# Patient Record
Sex: Male | Born: 1995 | Race: White | Hispanic: No | Marital: Single | Smoking: Former smoker
Health system: Southern US, Community
[De-identification: ages and names within clinical notes are randomized; demographics above are authoritative.]

## PROBLEM LIST (undated history)

## (undated) DIAGNOSIS — K219 Gastro-esophageal reflux disease without esophagitis: Secondary | ICD-10-CM

## (undated) DIAGNOSIS — I10 Essential (primary) hypertension: Secondary | ICD-10-CM

## (undated) DIAGNOSIS — F419 Anxiety disorder, unspecified: Secondary | ICD-10-CM

## (undated) DIAGNOSIS — I480 Paroxysmal atrial fibrillation: Secondary | ICD-10-CM

## (undated) HISTORY — DX: Essential (primary) hypertension: I10

## (undated) HISTORY — DX: Paroxysmal atrial fibrillation: I48.0

---

## 2018-10-26 ENCOUNTER — Other Ambulatory Visit: Payer: Self-pay

## 2018-10-30 ENCOUNTER — Ambulatory Visit: Payer: Self-pay | Admitting: Family Medicine

## 2018-11-01 ENCOUNTER — Other Ambulatory Visit: Payer: Self-pay

## 2018-11-01 ENCOUNTER — Encounter: Payer: Self-pay | Admitting: Family Medicine

## 2018-11-01 ENCOUNTER — Ambulatory Visit (INDEPENDENT_AMBULATORY_CARE_PROVIDER_SITE_OTHER): Payer: Self-pay | Admitting: Family Medicine

## 2018-11-01 VITALS — BP 118/64 | HR 91 | Temp 98.0°F | Resp 16 | Ht 68.5 in | Wt 217.8 lb

## 2018-11-01 DIAGNOSIS — Z0000 Encounter for general adult medical examination without abnormal findings: Secondary | ICD-10-CM

## 2018-11-01 DIAGNOSIS — E559 Vitamin D deficiency, unspecified: Secondary | ICD-10-CM

## 2018-11-01 DIAGNOSIS — Z6832 Body mass index (BMI) 32.0-32.9, adult: Secondary | ICD-10-CM

## 2018-11-01 DIAGNOSIS — Z713 Dietary counseling and surveillance: Secondary | ICD-10-CM

## 2018-11-01 LAB — CBC WITH DIFFERENTIAL/PLATELET
Basophils Absolute: 0.1 10*3/uL (ref 0.0–0.1)
Basophils Relative: 0.8 % (ref 0.0–3.0)
Eosinophils Absolute: 0.2 10*3/uL (ref 0.0–0.7)
Eosinophils Relative: 3.5 % (ref 0.0–5.0)
HCT: 43 % (ref 39.0–52.0)
Hemoglobin: 14.3 g/dL (ref 13.0–17.0)
Lymphocytes Relative: 30.2 % (ref 12.0–46.0)
Lymphs Abs: 2 10*3/uL (ref 0.7–4.0)
MCHC: 33.3 g/dL (ref 30.0–36.0)
MCV: 96.5 fl (ref 78.0–100.0)
Monocytes Absolute: 0.7 10*3/uL (ref 0.1–1.0)
Monocytes Relative: 10.1 % (ref 3.0–12.0)
Neutro Abs: 3.8 10*3/uL (ref 1.4–7.7)
Neutrophils Relative %: 55.4 % (ref 43.0–77.0)
Platelets: 282 10*3/uL (ref 150.0–400.0)
RBC: 4.46 Mil/uL (ref 4.22–5.81)
RDW: 12.4 % (ref 11.5–15.5)
WBC: 6.8 10*3/uL (ref 4.0–10.5)

## 2018-11-01 LAB — COMPREHENSIVE METABOLIC PANEL
ALT: 15 U/L (ref 0–53)
AST: 17 U/L (ref 0–37)
Albumin: 4.7 g/dL (ref 3.5–5.2)
Alkaline Phosphatase: 64 U/L (ref 39–117)
BUN: 10 mg/dL (ref 6–23)
CO2: 26 mEq/L (ref 19–32)
Calcium: 9.7 mg/dL (ref 8.4–10.5)
Chloride: 104 mEq/L (ref 96–112)
Creatinine, Ser: 0.87 mg/dL (ref 0.40–1.50)
GFR: 106.65 mL/min (ref >60.00)
Glucose, Bld: 96 mg/dL (ref 70–99)
Potassium: 4.2 mEq/L (ref 3.5–5.1)
Sodium: 140 mEq/L (ref 135–145)
Total Bilirubin: 0.5 mg/dL (ref 0.2–1.2)
Total Protein: 7.5 g/dL (ref 6.0–8.3)

## 2018-11-01 LAB — LIPID PANEL
Cholesterol: 158 mg/dL (ref 0–200)
HDL: 50.7 mg/dL (ref >39.00)
LDL Cholesterol: 97 mg/dL (ref 0–99)
NonHDL: 107.58
Total CHOL/HDL Ratio: 3
Triglycerides: 52 mg/dL (ref 0.0–149.0)
VLDL: 10.4 mg/dL (ref 0.0–40.0)

## 2018-11-01 LAB — VITAMIN D 25 HYDROXY (VIT D DEFICIENCY, FRACTURES): VITD: 13.62 ng/mL — ABNORMAL LOW (ref 30.00–100.00)

## 2018-11-01 LAB — TSH: TSH: 1.52 u[IU]/mL (ref 0.35–4.50)

## 2018-11-01 LAB — B12 AND FOLATE PANEL
Folate: 11.1 ng/mL (ref >5.9)
Vitamin B-12: 415 pg/mL (ref 211–911)

## 2018-11-01 NOTE — Patient Instructions (Signed)
Exercising to Lose Weight Exercise is structured, repetitive physical activity to improve fitness and health. Getting regular exercise is important for everyone. It is especially important if you are overweight. Being overweight increases your risk of heart disease, stroke, diabetes, high blood pressure, and several types of cancer. Reducing your calorie intake and exercising can help you lose weight. Exercise is usually categorized as moderate or vigorous intensity. To lose weight, most people need to do a certain amount of moderate-intensity or vigorous-intensity exercise each week. Moderate-intensity exercise  Moderate-intensity exercise is any activity that gets you moving enough to burn at least three times more energy (calories) than if you were sitting. Examples of moderate exercise include:  Walking a mile in 15 minutes.  Doing light yard work.  Biking at an easy pace. Most people should get at least 150 minutes (2 hours and 30 minutes) a week of moderate-intensity exercise to maintain their body weight. Vigorous-intensity exercise Vigorous-intensity exercise is any activity that gets you moving enough to burn at least six times more calories than if you were sitting. When you exercise at this intensity, you should be working hard enough that you are not able to carry on a conversation. Examples of vigorous exercise include:  Running.  Playing a team sport, such as football, basketball, and soccer.  Jumping rope. Most people should get at least 75 minutes (1 hour and 15 minutes) a week of vigorous-intensity exercise to maintain their body weight. How can exercise affect me? When you exercise enough to burn more calories than you eat, you lose weight. Exercise also reduces body fat and builds muscle. The more muscle you have, the more calories you burn. Exercise also:  Improves mood.  Reduces stress and tension.  Improves your overall fitness, flexibility, and endurance.   Increases bone strength. The amount of exercise you need to lose weight depends on:  Your age.  The type of exercise.  Any health conditions you have.  Your overall physical ability. Talk to your health care provider about how much exercise you need and what types of activities are safe for you. What actions can I take to lose weight? Nutrition   Make changes to your diet as told by your health care provider or diet and nutrition specialist (dietitian). This may include: ? Eating fewer calories. ? Eating more protein. ? Eating less unhealthy fats. ? Eating a diet that includes fresh fruits and vegetables, whole grains, low-fat dairy products, and lean protein. ? Avoiding foods with added fat, salt, and sugar.  Drink plenty of water while you exercise to prevent dehydration or heat stroke. Activity  Choose an activity that you enjoy and set realistic goals. Your health care provider can help you make an exercise plan that works for you.  Exercise at a moderate or vigorous intensity most days of the week. ? The intensity of exercise may vary from person to person. You can tell how intense a workout is for you by paying attention to your breathing and heartbeat. Most people will notice their breathing and heartbeat get faster with more intense exercise.  Do resistance training twice each week, such as: ? Push-ups. ? Sit-ups. ? Lifting weights. ? Using resistance bands.  Getting short amounts of exercise can be just as helpful as long structured periods of exercise. If you have trouble finding time to exercise, try to include exercise in your daily routine. ? Get up, stretch, and walk around every 30 minutes throughout the day. ? Go for a   walk during your lunch break. ? Park your car farther away from your destination. ? If you take public transportation, get off one stop early and walk the rest of the way. ? Make phone calls while standing up and walking around. ? Take the  stairs instead of elevators or escalators.  Wear comfortable clothes and shoes with good support.  Do not exercise so much that you hurt yourself, feel dizzy, or get very short of breath. Where to find more information  [place] Department of Health and Human Services: ThisPath.fiwww.hhs.gov  Centers for Disease Control and Prevention (CDC): FootballExhibition.com.brwww.cdc.gov Contact a health care provider:  Before starting a new exercise program.  If you have questions or concerns about your weight.  If you have a medical problem that keeps you from exercising. Get help right away if you have any of the following while exercising:  Injury.  Dizziness.  Difficulty breathing or shortness of breath that does not go away when you stop exercising.  Chest pain.  Rapid heartbeat. Summary  Being overweight increases your risk of heart disease, stroke, diabetes, high blood pressure, and several types of cancer.  Losing weight happens when you burn more calories than you eat.  Reducing the amount of calories you eat in addition to getting regular moderate or vigorous exercise each week helps you lose weight. This information is not intended to replace advice given to you by your health care provider. Make sure you discuss any questions you have with your health care provider. Document Released: 05/15/2010 Document Revised: 04/25/2017 Document Reviewed: 04/25/2017 Elsevier Patient Education  2020 Elsevier Inc.   Calorie Counting for [employer] Calories are units of energy. Your body needs a certain amount of calories from food to keep you going throughout the day. When you eat more calories than your body needs, your body stores the extra calories as fat. When you eat fewer calories than your body needs, your body burns fat to get the energy it needs. Calorie counting means keeping track of how many calories you eat and drink each day. Calorie counting can be helpful if you need to lose weight. If you make sure to eat  fewer calories than your body needs, you should lose weight. Ask your health care provider what a healthy weight is for you. For calorie counting to work, you will need to eat the right number of calories in a day in order to lose a healthy amount of weight per week. A dietitian can help you determine how many calories you need in a day and will give you suggestions on how to reach your calorie goal.  A healthy amount of weight to lose per week is usually 1-2 lb (0.5-0.9 kg). This usually means that your daily calorie intake should be reduced by 500-750 calories.  Eating 1,200 - 1,500 calories per day can help most women lose weight.  Eating 1,500 - 1,800 calories per day can help most men lose weight. What do I need to know about calorie counting? In order to meet your daily calorie goal, you will need to:  Find out how many calories are in each food you would like to eat. Try to do this before you eat.  Decide how much of the food you plan to eat.  Write down what you ate and how many calories it had. Doing this is called keeping a food log. To successfully lose weight, it is important to balance calorie counting with a healthy lifestyle that includes regular  activity. Aim for 150 minutes of moderate exercise (such as walking) or 75 minutes of vigorous exercise (such as running) each week. Where do I find calorie information?  The number of calories in a food can be found on a Nutrition Facts label. If a food does not have a Nutrition Facts label, try to look up the calories online or ask your dietitian for help. Remember that calories are listed per serving. If you choose to have more than one serving of a food, you will have to multiply the calories per serving by the amount of servings you plan to eat. For example, the label on a package of bread might say that a serving size is 1 slice and that there are 90 calories in a serving. If you eat 1 slice, you will have eaten 90 calories. If you  eat 2 slices, you will have eaten 180 calories. How do I keep a food log? Immediately after each meal, record the following information in your food log:  What you ate. Don't forget to include toppings, sauces, and other extras on the food.  How much you ate. This can be measured in cups, ounces, or number of items.  How many calories each food and drink had.  The total number of calories in the meal. Keep your food log near you, such as in a small notebook in your pocket, or use a mobile app or website. Some programs will calculate calories for you and show you how many calories you have left for the day to meet your goal. What are some calorie counting tips?   Use your calories on foods and drinks that will fill you up and not leave you hungry: ? Some examples of foods that fill you up are nuts and nut butters, vegetables, lean proteins, and high-fiber foods like whole grains. High-fiber foods are foods with more than 5 g fiber per serving. ? Drinks such as sodas, specialty coffee drinks, alcohol, and juices have a lot of calories, yet do not fill you up.  Eat nutritious foods and avoid empty calories. Empty calories are calories you get from foods or beverages that do not have many vitamins or protein, such as candy, sweets, and soda. It is better to have a nutritious high-calorie food (such as an avocado) than a food with few nutrients (such as a bag of chips).  Know how many calories are in the foods you eat most often. This will help you calculate calorie counts faster.  Pay attention to calories in drinks. Low-calorie drinks include water and unsweetened drinks.  Pay attention to nutrition labels for "low fat" or "fat free" foods. These foods sometimes have the same amount of calories or more calories than the full fat versions. They also often have added sugar, starch, or salt, to make up for flavor that was removed with the fat.  Find a way of tracking calories that works for you.  Get creative. Try different apps or programs if writing down calories does not work for you. What are some portion control tips?  Know how many calories are in a serving. This will help you know how many servings of a certain food you can have.  Use a measuring cup to measure serving sizes. You could also try weighing out portions on a kitchen scale. With time, you will be able to estimate serving sizes for some foods.  Take some time to put servings of different foods on your favorite plates, bowls, and cups  so you know what a serving looks like.  Try not to eat straight from a bag or box. Doing this can lead to overeating. Put the amount you would like to eat in a cup or on a plate to make sure you are eating the right portion.  Use smaller plates, glasses, and bowls to prevent overeating.  Try not to multitask (for example, watch TV or use your computer) while eating. If it is time to eat, sit down at a table and enjoy your food. This will help you to know when you are full. It will also help you to be aware of what you are eating and how much you are eating. What are tips for following this plan? Reading food labels  Check the calorie count compared to the serving size. The serving size may be smaller than what you are used to eating.  Check the source of the calories. Make sure the food you are eating is high in vitamins and protein and low in saturated and trans fats. Shopping  Read nutrition labels while you shop. This will help you make healthy decisions before you decide to purchase your food.  Make a grocery list and stick to it. Cooking  Try to cook your favorite foods in a healthier way. For example, try baking instead of frying.  Use low-fat dairy products. Meal planning  Use more fruits and vegetables. Half of your plate should be fruits and vegetables.  Include lean proteins like poultry and fish. How do I count calories when eating out?  Ask for smaller portion  sizes.  Consider sharing an entree and sides instead of getting your own entree.  If you get your own entree, eat only half. Ask for a box at the beginning of your meal and put the rest of your entree in it so you are not tempted to eat it.  If calories are listed on the menu, choose the lower calorie options.  Choose dishes that include vegetables, fruits, whole grains, low-fat dairy products, and lean protein.  Choose items that are boiled, broiled, grilled, or steamed. Stay away from items that are buttered, battered, fried, or served with cream sauce. Items labeled "crispy" are usually fried, unless stated otherwise.  Choose water, low-fat milk, unsweetened iced tea, or other drinks without added sugar. If you want an alcoholic beverage, choose a lower calorie option such as a glass of wine or light beer.  Ask for dressings, sauces, and syrups on the side. These are usually high in calories, so you should limit the amount you eat.  If you want a salad, choose a garden salad and ask for grilled meats. Avoid extra toppings like bacon, cheese, or fried items. Ask for the dressing on the side, or ask for olive oil and vinegar or lemon to use as dressing.  Estimate how many servings of a food you are given. For example, a serving of cooked rice is  cup or about the size of half a baseball. Knowing serving sizes will help you be aware of how much food you are eating at restaurants. The list below tells you how big or small some common portion sizes are based on everyday objects: ? 1 oz-4 stacked dice. ? 3 oz-1 deck of cards. ? 1 tsp-1 die. ? 1 Tbsp- a ping-pong ball. ? 2 Tbsp-1 ping-pong ball. ?  cup- baseball. ? 1 cup-1 baseball. Summary  Calorie counting means keeping track of how many calories you eat and drink each  day. If you eat fewer calories than your body needs, you should lose weight.  A healthy amount of weight to lose per week is usually 1-2 lb (0.5-0.9 kg). This usually  means reducing your daily calorie intake by 500-750 calories.  The number of calories in a food can be found on a Nutrition Facts label. If a food does not have a Nutrition Facts label, try to look up the calories online or ask your dietitian for help.  Use your calories on foods and drinks that will fill you up, and not on foods and drinks that will leave you hungry.  Use smaller plates, glasses, and bowls to prevent overeating. This information is not intended to replace advice given to you by your health care provider. Make sure you discuss any questions you have with your health care provider. Document Released: 04/12/2005 Document Revised: 12/30/2017 Document Reviewed: 03/12/2016 Elsevier Patient Education  2020 [employer]

## 2018-11-01 NOTE — Progress Notes (Signed)
Subjective:    Patient ID: Joseph Joseph, Joseph Joseph    DOB: 1995-12-22, 23 y.o.   MRN: [id number]  HPI   Patient presents to clinic to establish with primary care and for well adult exam.  Overall he is feeling well and has no complaints.  Only concern is that he gained some weight throughout the pandemic due to being out of work for approximately 4 months.  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  Tetanus vaccine is up-to-date, had in 2018.  He does see dentist twice per year and sees eye doctor every 1 to 2 years.  Currently does not wear glasses or contacts.  While at work they practice social distancing by spacing out the tables, he wears a mask for the shift and washes his hands diligently.  Denies any concerns for STDs.  Past medical, social, surgical & family history reviewed and updated accordingly in chart.  Patient Active Problem List   Diagnosis Date Noted  . BMI 32.0-32.9,adult 11/01/2018   Social History   Tobacco Use  . Smoking status: Former Games developermoker  . Smokeless tobacco: Never Used  Substance Use Topics  . Alcohol use: Yes    Comment: ocassionally   History reviewed. No pertinent surgical history.  History reviewed. No pertinent family history.   Review of Systems  Constitutional: Negative for chills, fatigue and fever.  HENT: Negative for congestion, ear pain, sinus pain and sore throat.   Eyes: Negative.   Respiratory: Negative for cough, shortness of breath and wheezing.   Cardiovascular: Negative for chest pain, palpitations and leg swelling.  Gastrointestinal: Negative for abdominal pain, diarrhea, nausea and vomiting.  Genitourinary: Negative for dysuria, frequency and urgency.  Musculoskeletal: Negative for arthralgias and myalgias.  Skin: Negative for color change, pallor and rash.  Neurological:  Negative for syncope, light-headedness and headaches.  Psychiatric/Behavioral: The patient is not nervous/anxious.       Objective:   Physical Exam Vitals signs and nursing note reviewed.  Constitutional:      General: He is not in acute distress.    Appearance: He is obese. He is not ill-appearing, toxic-appearing or diaphoretic.  HENT:     Head: Normocephalic and atraumatic.     Right Ear: Tympanic membrane, ear canal and external ear normal.     Left Ear: Tympanic membrane, ear canal and external ear normal.  Eyes:     General: No scleral icterus.    Extraocular Movements: Extraocular movements intact.     Conjunctiva/sclera: Conjunctivae normal.     Pupils: Pupils are equal, round, and reactive to light.  Neck:     Musculoskeletal: Normal range of motion and neck supple. No neck rigidity or muscular tenderness.  Cardiovascular:     Rate and Rhythm: Normal rate and regular rhythm.     Heart sounds: Normal heart sounds.  Pulmonary:     Effort: Pulmonary effort is normal. No respiratory distress.     Breath sounds: Normal breath sounds.  Abdominal:     General: Bowel sounds are normal. There is no distension.     Palpations: Abdomen is soft. There is no mass.     Tenderness: There is no abdominal tenderness. There is no right CVA tenderness, left CVA tenderness, guarding or rebound.     Hernia: No hernia is present.  Musculoskeletal:  Normal range of motion.     Right lower leg: No edema.     Left lower leg: No edema.  Lymphadenopathy:     Cervical: No cervical adenopathy.  Skin:    General: Skin is warm and dry.     Capillary Refill: Capillary refill takes less than 2 seconds.     Coloration: Skin is not jaundiced or pale.  Neurological:     General: No focal deficit present.     Mental Status: He is alert and oriented to person, place, and time.     Cranial Nerves: No cranial nerve deficit.     Motor: No weakness.     Gait: Gait normal.  Psychiatric:        Mood and  Affect: Mood normal.        Behavior: Behavior normal.        Thought Content: Thought content normal.        Judgment: Judgment normal.    Today's Vitals   11/01/18 1142  BP: 118/64  Pulse: 91  Resp: 16  Temp: 98 F (36.7 C)  TempSrc: Oral  SpO2: 95%  Weight: 217 lb 12.8 oz (98.8 kg)  Height: 5' 8.5" (1.74 m)   Body mass index is 32.63 kg/m.     Assessment & Plan:   Well adult exam - patient appears to be overall healthy 23 year old Joseph Joseph.  We will get screening lab work in clinic today to establish baseline.  Recommended safe sun practices of wearing at least SPF 30 when outdoors for extended period of time, hat to protect face and long sleeves to protect skin of arms.  Patient always wears seatbelt when in car.  Patient sees dentist and eye doctor regularly.  Discussed safe sex practices including condom use to prevent STDs.  Weight loss counseling/BMI 32.63 -- discussed healthy diet choices including lean proteins, lots of vegetables and fruits and lower carbs.  Also discussed calorie counting to help promote weight loss.  Also discussed regular physical activity to help promote weight loss including aerobic activity, weightlifting, walking and building up stamina and strength as exercise routines progress.  Patient aware that we will contact him with lab results when they are available.  Next step in plan of care will determine lab results and if patient needs to return to clinic sooner than for annual physical exams.  Patient also aware of flu vaccines will be available around September 2020

## 2018-11-02 MED ORDER — VITAMIN D (ERGOCALCIFEROL) 1.25 MG (50000 UNIT) PO CAPS: 50000.0000 [IU] | ORAL_CAPSULE | ORAL | 0 refills | Status: DC

## 2018-11-02 NOTE — Addendum Note (Signed)
Addended by: Philis Nettle on: 11/02/2018 05:13 PM   Modules accepted: Orders

## 2018-11-03 ENCOUNTER — Ambulatory Visit: Payer: Self-pay | Admitting: Family Medicine

## 2019-01-26 ENCOUNTER — Other Ambulatory Visit: Payer: Self-pay

## 2019-03-05 ENCOUNTER — Other Ambulatory Visit: Payer: Self-pay

## 2019-03-05 DIAGNOSIS — Z20822 Contact with and (suspected) exposure to covid-19: Secondary | ICD-10-CM

## 2019-03-06 ENCOUNTER — Telehealth: Payer: Self-pay | Admitting: Family Medicine

## 2019-03-06 ENCOUNTER — Encounter: Payer: Self-pay | Admitting: Family Medicine

## 2019-03-06 LAB — NOVEL CORONAVIRUS, NAA: SARS-CoV-2, NAA: DETECTED — AB

## 2019-03-06 NOTE — Telephone Encounter (Signed)
Noted thank you  LG

## 2019-03-06 NOTE — Telephone Encounter (Signed)
Critical result positive COVID!! [facility] notified office patient has not been notified.

## 2019-03-15 ENCOUNTER — Telehealth: Payer: Self-pay

## 2019-03-15 NOTE — Telephone Encounter (Signed)
Copied from [place] [id number]. Topic: General - Other >> Mar 15, 2019 11:53 AM Yvette Rack wrote: Reason for CRM: Pt stated he wanted to make the office aware that a leave of absence form was faxed from [place] from [employer].

## 2019-03-19 NOTE — Telephone Encounter (Signed)
Patient called to ask if the leave of absence forms were faxed to the office.  Please confirm and call patient back at [phone]

## 2019-03-19 NOTE — Telephone Encounter (Signed)
I spoke with patient & let him know that I had filled out FMLA paperwork. I have placed in Dr. Lupita Dawn folder for review & signing so I can fax back. I made patient aware if this & will let him know when it is faxed.

## 2019-03-19 NOTE — Telephone Encounter (Signed)
Patient is checking status on on FMLA paper work. Call back [phone]

## 2019-03-20 NOTE — Telephone Encounter (Signed)
Patient called and notified tat paperwork was faxed back & received fax confirmation.

## 2020-01-07 ENCOUNTER — Other Ambulatory Visit: Payer: Self-pay

## 2020-04-07 ENCOUNTER — Other Ambulatory Visit: Payer: Self-pay

## 2020-04-07 ENCOUNTER — Encounter: Payer: Self-pay | Admitting: Emergency Medicine

## 2020-04-07 ENCOUNTER — Emergency Department: Payer: Self-pay

## 2020-04-07 DIAGNOSIS — Z87891 Personal history of nicotine dependence: Secondary | ICD-10-CM | POA: Insufficient documentation

## 2020-04-07 DIAGNOSIS — M79602 Pain in left arm: Secondary | ICD-10-CM | POA: Insufficient documentation

## 2020-04-07 DIAGNOSIS — R202 Paresthesia of skin: Secondary | ICD-10-CM | POA: Insufficient documentation

## 2020-04-07 DIAGNOSIS — R079 Chest pain, unspecified: Secondary | ICD-10-CM | POA: Insufficient documentation

## 2020-04-07 LAB — CBC
HCT: 42.7 % (ref 39.0–52.0)
Hemoglobin: 14.5 g/dL (ref 13.0–17.0)
MCH: 31.7 pg (ref 26.0–34.0)
MCHC: 34 g/dL (ref 30.0–36.0)
MCV: 93.2 fL (ref 80.0–100.0)
Platelets: 263 10*3/uL (ref 150–400)
RBC: 4.58 MIL/uL (ref 4.22–5.81)
RDW: 12.7 % (ref 11.5–15.5)
WBC: 11.5 10*3/uL — ABNORMAL HIGH (ref 4.0–10.5)
nRBC: 0 % (ref 0.0–0.2)

## 2020-04-07 LAB — BASIC METABOLIC PANEL
Anion gap: 10 (ref 5–15)
BUN: 11 mg/dL (ref 6–20)
CO2: 25 mmol/L (ref 22–32)
Calcium: 9.5 mg/dL (ref 8.9–10.3)
Chloride: 105 mmol/L (ref 98–111)
Creatinine, Ser: 0.78 mg/dL (ref 0.61–1.24)
GFR, Estimated: >60 mL/min (ref >60)
Glucose, Bld: 116 mg/dL — ABNORMAL HIGH (ref 70–99)
Potassium: 4.1 mmol/L (ref 3.5–5.1)
Sodium: 140 mmol/L (ref 135–145)

## 2020-04-07 LAB — TROPONIN I (HIGH SENSITIVITY)
Troponin I (High Sensitivity): 3 ng/L (ref <18)
Troponin I (High Sensitivity): 3 ng/L (ref <18)
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
## 2020-04-08 ENCOUNTER — Emergency Department
Admission: EM | Admit: 2020-04-08 | Discharge: 2020-04-08 | Disposition: A | Discharge status: Home / Self Care | Payer: Self-pay | Attending: Emergency Medicine | Admitting: Emergency Medicine

## 2020-04-08 DIAGNOSIS — R079 Chest pain, unspecified: Secondary | ICD-10-CM

## 2020-04-08 NOTE — ED Notes (Signed)
E-sign not working. Printed, signed and placed in medical records.

## 2020-04-08 NOTE — ED Provider Notes (Signed)
[facility] Emergency Department Provider Note   ____________________________________________   Event Date/Time   First MD Initiated Contact with Patient 04/08/20 0003     (approximate)  I have reviewed the triage vital signs and the nursing notes.   HISTORY  Chief Complaint Chest Pain    HPI Joseph Joseph is a 24 y.o. male with no significant past medical history who presents to the ED complaining of chest pain.  Patient reports that he was driving when he had sudden onset sharp pain in the left side of his chest that seem to radiate into his left arm.  The sharp pain lasted for only a few seconds but he does still have very mild aching pain in the left side of his chest ongoing since then.  He had some numbness and tingling in his left arm with the onset of the pain, however this has also improved and he denies any weakness in his arm.  He denies any recent trauma to his chest and has never had similar symptoms in the past.  He has not had any fevers, cough, shortness of breath, pain or swelling in his legs.        History reviewed. No pertinent past medical history.  Patient Active Problem List   Diagnosis Date Noted  . BMI 32.0-32.9,adult 11/01/2018    History reviewed. No pertinent surgical history.  Prior to Admission medications   Medication Sig Start Date End Date Taking? Authorizing Provider  Vitamin D, Ergocalciferol, (DRISDOL) 1.25 MG (50000 UT) CAPS capsule Take 1 capsule (50,000 Units total) by mouth every 7 (seven) days. 11/02/18   Tracey Harries, FNP    Allergies Rocephin [ceftriaxone]  History reviewed. No pertinent family history.  Social History Social History   Tobacco Use  . Smoking status: Former Games developer  . Smokeless tobacco: Never Used  Vaping Use  . Vaping Use: Never used  Substance Use Topics  . Alcohol use: Yes    Comment: 4-5 drinks per week  . Drug use: Never    Review of Systems  Constitutional: No  fever/chills Eyes: No visual changes. ENT: No sore throat. Cardiovascular: Positive for chest pain. Respiratory: Denies shortness of breath. Gastrointestinal: No abdominal pain.  No nausea, no vomiting.  No diarrhea.  No constipation. Genitourinary: Negative for dysuria. Musculoskeletal: Negative for back pain. Skin: Negative for rash. Neurological: Negative for headaches or focal weakness.  Positive for left arm numbness and tingling.  ____________________________________________   PHYSICAL EXAM:  VITAL SIGNS: ED Triage Vitals  Enc Vitals Group     BP 04/07/20 1835 128/74     Pulse Rate 04/07/20 1835 76     Resp 04/07/20 1835 18     Temp 04/07/20 1835 98.6 F (37 C)     Temp Source 04/07/20 1835 Oral     SpO2 04/07/20 1835 99 %     Weight 04/07/20 1836 200 lb (90.7 kg)     Height 04/07/20 1836 5\' 8"  (1.727 m)     Head Circumference --      Peak Flow --      Pain Score 04/07/20 1835 1     Pain Loc --      Pain Edu? --      Excl. in GC? --     Constitutional: Alert and oriented. Eyes: Conjunctivae are normal. Head: Atraumatic. Nose: No congestion/rhinnorhea. Mouth/Throat: Mucous membranes are moist. Neck: Normal ROM Cardiovascular: Normal rate, regular rhythm. Grossly normal heart sounds.  2+ radial pulses  bilaterally. Respiratory: Normal respiratory effort.  No retractions. Lungs CTAB. Gastrointestinal: Soft and nontender. No distention. Genitourinary: deferred Musculoskeletal: No lower extremity tenderness nor edema. Neurologic:  Normal speech and language. No gross focal neurologic deficits are appreciated.  Capital 5 strength in bilateral upper extremities with no pronator drift. Skin:  Skin is warm, dry and intact. No rash noted. Psychiatric: Mood and affect are normal. Speech and behavior are normal.  ____________________________________________   LABS (all labs ordered are listed, but only abnormal results are displayed)  Labs Reviewed  BASIC METABOLIC  PANEL - Abnormal; Notable for the following components:      Result Value   Glucose, Bld 116 (*)    All other components within normal limits  CBC - Abnormal; Notable for the following components:   WBC 11.5 (*)    All other components within normal limits  TROPONIN I (HIGH SENSITIVITY)  TROPONIN I (HIGH SENSITIVITY)   ____________________________________________  EKG  ED ECG REPORT I, Chesley Noon, the attending physician, personally viewed and interpreted this ECG.   Date: 04/08/2020  EKG Time: 18:41  Rate: 91  Rhythm: normal sinus rhythm  Axis: Normal  Intervals:none  ST&T Change: None   PROCEDURES  Procedure(s) performed (including Critical Care):  Procedures   ____________________________________________   INITIAL IMPRESSION / ASSESSMENT AND PLAN / ED COURSE       24 year old male with no significant past medical history who presents to the ED complaining of sudden onset sharp in the left eyelid of his chest that has now eased up with only a mild aching remaining.  EKG reviewed and shows no evidence of arrhythmia or ischemia, 2 sets of troponin are negative.  Chest x-ray also reviewed by me and shows no infiltrate, edema, or effusion.  I doubt PE as patient is PERC negative and dissection unlikely with his nonresolving symptoms.  He is neurovascular intact to his bilateral upper extremities.  Lab work is unremarkable and patient is appropriate for discharge home with PCP follow-up.  He was counseled to return to the ED for new worsening symptoms, patient agrees with plan.      ____________________________________________   FINAL CLINICAL IMPRESSION(S) / ED DIAGNOSES  Final diagnoses:  Nonspecific chest pain     ED Discharge Orders    None       Note:  This document was prepared using Dragon voice recognition software and may include unintentional dictation errors.   Chesley Noon, MD 04/08/20 [phone]

## 2020-10-05 ENCOUNTER — Emergency Department
Admission: EM | Admit: 2020-10-05 | Discharge: 2020-10-05 | Disposition: A | Discharge status: Home / Self Care | Payer: Self-pay | Attending: Emergency Medicine | Admitting: Emergency Medicine

## 2020-10-05 ENCOUNTER — Other Ambulatory Visit: Payer: Self-pay

## 2020-10-05 DIAGNOSIS — R110 Nausea: Secondary | ICD-10-CM | POA: Insufficient documentation

## 2020-10-05 DIAGNOSIS — Z87891 Personal history of nicotine dependence: Secondary | ICD-10-CM | POA: Insufficient documentation

## 2020-10-05 DIAGNOSIS — R1031 Right lower quadrant pain: Secondary | ICD-10-CM | POA: Insufficient documentation

## 2020-10-05 LAB — COMPREHENSIVE METABOLIC PANEL
ALT: 15 U/L (ref 0–44)
AST: 19 U/L (ref 15–41)
Albumin: 4.4 g/dL (ref 3.5–5.0)
Alkaline Phosphatase: 55 U/L (ref 38–126)
Anion gap: 9 (ref 5–15)
BUN: 12 mg/dL (ref 6–20)
CO2: 23 mmol/L (ref 22–32)
Calcium: 9.4 mg/dL (ref 8.9–10.3)
Chloride: 105 mmol/L (ref 98–111)
Creatinine, Ser: 0.8 mg/dL (ref 0.61–1.24)
GFR, Estimated: >60 mL/min (ref >60)
Glucose, Bld: 87 mg/dL (ref 70–99)
Potassium: 3.9 mmol/L (ref 3.5–5.1)
Sodium: 137 mmol/L (ref 135–145)
Total Bilirubin: 1.4 mg/dL — ABNORMAL HIGH (ref 0.3–1.2)
Total Protein: 7.6 g/dL (ref 6.5–8.1)

## 2020-10-05 LAB — CBC
HCT: 43.2 % (ref 39.0–52.0)
Hemoglobin: 14.9 g/dL (ref 13.0–17.0)
MCH: 31.1 pg (ref 26.0–34.0)
MCHC: 34.5 g/dL (ref 30.0–36.0)
MCV: 90.2 fL (ref 80.0–100.0)
Platelets: 257 10*3/uL (ref 150–400)
RBC: 4.79 MIL/uL (ref 4.22–5.81)
RDW: 12.3 % (ref 11.5–15.5)
WBC: 9.8 10*3/uL (ref 4.0–10.5)
nRBC: 0 % (ref 0.0–0.2)

## 2020-10-05 LAB — URINALYSIS, COMPLETE (UACMP) WITH MICROSCOPIC
Bacteria, UA: NONE SEEN
Bilirubin Urine: NEGATIVE
Glucose, UA: NEGATIVE mg/dL
Ketones, ur: 20 mg/dL — AB
Leukocytes,Ua: NEGATIVE
Nitrite: NEGATIVE
Protein, ur: NEGATIVE mg/dL
Specific Gravity, Urine: 1.001 — ABNORMAL LOW (ref 1.005–1.030)
Squamous Epithelial / HPF: NONE SEEN (ref 0–5)
pH: 6 (ref 5.0–8.0)

## 2020-10-05 LAB — LIPASE, BLOOD: Lipase: 28 U/L (ref 11–51)

## 2020-10-05 MED ORDER — DICYCLOMINE HCL 10 MG PO CAPS: 10.0000 mg | ORAL_CAPSULE | Freq: Three times a day (TID) | ORAL | 0 refills | Status: DC | PRN

## 2020-10-05 NOTE — Discharge Instructions (Addendum)
Try taking Bentyl capsules up to 3 times a day for your cramping and pain.  This is a very safe and benign medicine that can help with irritable bowel and cramping.  Follow-up with your PCP in the next week or 2 to discuss your symptoms and see if you are getting better with this medication.  Return to the ED with any severely worsening pain, fevers with your pain or recurrently vomiting

## 2020-10-05 NOTE — ED Triage Notes (Signed)
Pt arrives to ED from home via PoV with c/c of right side abdominal pain beginning approx 3 days ago. Pt complains of decreased appetite, last bowel movement was over 24 hours ago. Pain described as "sharp cramping", intermittent. Pt denies F/C, NVD. Pt is currently taking pantoprazole for Gerd DX but reports no relief. Upon arrival, Pt A&Ox4, NAD.

## 2020-10-05 NOTE — ED Provider Notes (Signed)
[facility] Provider Note ____________________________________________   Event Date/Time   First MD Initiated Contact with Patient 10/05/20 [phone]     (approximate)  I have reviewed the triage vital signs and the nursing notes.  HISTORY  Chief Complaint Abdominal Pain   HPI Joseph Joseph is a 25 y.o. malewho presents to the ED for evaluation of abd pain.   Chart review indicates hx obesity.   Patient presents to the ED for evaluation of 3 days of right-sided abdominal cramping and pain he reports a constant cramping pain for a few days now that is sometimes associated with nausea, but no emesis.  Denies diarrhea or stool changes.  Denies dysuria or urinary changes.  Reports tolerating p.o. intake and toileting at his baseline.  Denies fevers or systemic symptoms.  No past medical history on file.  Patient Active Problem List   Diagnosis Date Noted   BMI 32.0-32.9,adult 11/01/2018    No past surgical history on file.  Prior to Admission medications   Medication Sig Start Date End Date Taking? Authorizing Provider  dicyclomine (BENTYL) 10 MG capsule Take 1 capsule (10 mg total) by mouth 3 (three) times daily as needed for spasms (abd cramping and spasms). 10/05/20 11/04/20 Yes Delton Prairie, MD  Vitamin D, Ergocalciferol, (DRISDOL) 1.25 MG (50000 UT) CAPS capsule Take 1 capsule (50,000 Units total) by mouth every 7 (seven) days. 11/02/18   Tracey Harries, FNP    Allergies Rocephin [ceftriaxone]  No family history on file.  Social History Social History   Tobacco Use   Smoking status: Former    Pack years: 0.00   Smokeless tobacco: Never  Vaping Use   Vaping Use: Never used  Substance Use Topics   Alcohol use: Yes    Comment: 4-5 drinks per week   Drug use: Never    Review of Systems  Constitutional: No fever/chills Eyes: No visual changes. ENT: No sore throat. Cardiovascular: Denies chest pain. Respiratory: Denies  shortness of breath. Gastrointestinal: Positive for abdominal pain and nausea no vomiting.  No diarrhea.  No constipation. Genitourinary: Negative for dysuria. Musculoskeletal: Negative for back pain. Skin: Negative for rash. Neurological: Negative for headaches, focal weakness or numbness.  ____________________________________________   PHYSICAL EXAM:  VITAL SIGNS: Vitals:   10/05/20 0507 10/05/20 0519  BP: 113/70 123/78  Pulse: 63 66  Resp: 19 18  Temp: 97.8 F (36.6 C)   SpO2: 99% 99%     Constitutional: Alert and oriented. Well appearing and in no acute distress. Eyes: Conjunctivae are normal. PERRL. EOMI. Head: Atraumatic. Nose: No congestion/rhinnorhea. Mouth/Throat: Mucous membranes are moist.  Oropharynx non-erythematous. Neck: No stridor. No cervical spine tenderness to palpation. Cardiovascular: Normal rate, regular rhythm. Grossly normal heart sounds.  Good peripheral circulation. Respiratory: Normal respiratory effort.  No retractions. Lungs CTAB. Gastrointestinal: Soft , nondistended, nontender to palpation. No CVA tenderness.  Soft and benign throughout.  Well-tolerated, even with quite deep palpation to the right lower quadrant Musculoskeletal: No lower extremity tenderness nor edema.  No joint effusions. No signs of acute trauma. Neurologic:  Normal speech and language. No gross focal neurologic deficits are appreciated. No gait instability noted. Skin:  Skin is warm, dry and intact. No rash noted. Psychiatric: Mood and affect are normal. Speech and behavior are normal.  ____________________________________________   LABS (all labs ordered are listed, but only abnormal results are displayed)  Labs Reviewed  COMPREHENSIVE METABOLIC PANEL - Abnormal; Notable for the following components:  Result Value   Total Bilirubin 1.4 (*)    All other components within normal limits  URINALYSIS, COMPLETE (UACMP) WITH MICROSCOPIC - Abnormal; Notable for the  following components:   Color, Urine COLORLESS (*)    APPearance CLEAR (*)    Specific Gravity, Urine 1.001 (*)    Hgb urine dipstick SMALL (*)    Ketones, ur 20 (*)    All other components within normal limits  LIPASE, BLOOD  CBC   ____________________________________________   PROCEDURES and INTERVENTIONS  Procedure(s) performed (including Critical Care):  Procedures  Medications - No data to display  ____________________________________________   MDM / ED COURSE   Obese 25 year old male presents to the ED with vague right-sided abdominal cramping pain, without evidence of acute derangements and amenable to outpatient management.  Normal vitals on room air.  Exam reassuring without evidence of acute pathology.  He has no abdominal tenderness that I can elicit on exam, even with deep palpation, particular to the RLQ at McBurney's point.  His blood work is benign and urine is without infectious features.  He is tolerating p.o. intake and toileting at baseline.  I see no indications for advanced imaging of his abdomen.  We will discharge with a short course of Bentyl to see if this will help and have him follow-up with his PCP.  Return precautions for the ED were discussed.     ____________________________________________   FINAL CLINICAL IMPRESSION(S) / ED DIAGNOSES  Final diagnoses:  Right lower quadrant abdominal pain     ED Discharge Orders          Ordered    dicyclomine (BENTYL) 10 MG capsule  3 times daily PRN        10/05/20 0548             Anushree Dorsi   Note:  This document was prepared using Dragon voice recognition software and may include unintentional dictation errors.    Delton Prairie, MD 10/05/20 [phone]

## 2020-10-23 ENCOUNTER — Ambulatory Visit: Payer: Self-pay | Admitting: Internal Medicine

## 2020-10-23 DIAGNOSIS — Z0289 Encounter for other administrative examinations: Secondary | ICD-10-CM

## 2021-02-21 ENCOUNTER — Other Ambulatory Visit: Payer: Self-pay

## 2021-02-21 ENCOUNTER — Emergency Department: Payer: No Typology Code available for payment source

## 2021-02-21 DIAGNOSIS — Z87891 Personal history of nicotine dependence: Secondary | ICD-10-CM | POA: Insufficient documentation

## 2021-02-21 DIAGNOSIS — H53149 Visual discomfort, unspecified: Secondary | ICD-10-CM | POA: Diagnosis not present

## 2021-02-21 DIAGNOSIS — R519 Headache, unspecified: Secondary | ICD-10-CM | POA: Diagnosis not present

## 2021-02-21 LAB — CBC
HCT: 44.6 % (ref 39.0–52.0)
Hemoglobin: 15.4 g/dL (ref 13.0–17.0)
MCH: 31.6 pg (ref 26.0–34.0)
MCHC: 34.5 g/dL (ref 30.0–36.0)
MCV: 91.6 fL (ref 80.0–100.0)
Platelets: 265 10*3/uL (ref 150–400)
RBC: 4.87 MIL/uL (ref 4.22–5.81)
RDW: 12.7 % (ref 11.5–15.5)
WBC: 11 10*3/uL — ABNORMAL HIGH (ref 4.0–10.5)
nRBC: 0 % (ref 0.0–0.2)

## 2021-02-21 LAB — BASIC METABOLIC PANEL
Anion gap: 12 (ref 5–15)
BUN: 10 mg/dL (ref 6–20)
CO2: 22 mmol/L (ref 22–32)
Calcium: 9.6 mg/dL (ref 8.9–10.3)
Chloride: 103 mmol/L (ref 98–111)
Creatinine, Ser: 0.77 mg/dL (ref 0.61–1.24)
GFR, Estimated: >60 mL/min (ref >60)
Glucose, Bld: 107 mg/dL — ABNORMAL HIGH (ref 70–99)
Potassium: 3.7 mmol/L (ref 3.5–5.1)
Sodium: 137 mmol/L (ref 135–145)

## 2021-02-21 NOTE — ED Triage Notes (Addendum)
Pt presents to ER c/o HA x1 month that has been getting worse on the left side over the last couple of weeks.  Pt states he is also having some new left sided numbness but denies weakness in his extremities.  No slurred speech noted.  LVO neg.  Pt does have family hx of stroke on his moms side.  No other acute neuro deficits.

## 2021-02-22 ENCOUNTER — Emergency Department
Admission: EM | Admit: 2021-02-22 | Discharge: 2021-02-22 | Disposition: A | Discharge status: Home / Self Care | Payer: No Typology Code available for payment source | Attending: Emergency Medicine | Admitting: Emergency Medicine

## 2021-02-22 DIAGNOSIS — R519 Headache, unspecified: Secondary | ICD-10-CM

## 2021-02-22 MED ORDER — KETOROLAC TROMETHAMINE 60 MG/2ML IM SOLN
30.0000 mg | Freq: Once | INTRAMUSCULAR | Status: AC
  Administered 2021-02-22: 08:00:00 30 mg via INTRAMUSCULAR
  Filled 2021-02-22: qty 2

## 2021-02-22 MED ORDER — KETOROLAC TROMETHAMINE 10 MG PO TABS: 10.0000 mg | ORAL_TABLET | Freq: Four times a day (QID) | ORAL | 0 refills | Status: DC | PRN

## 2021-02-22 NOTE — ED Provider Notes (Signed)
[facility] Emergency Department Provider Note ____________________________________________  Time seen: Approximately 7:45 AM  I have reviewed the triage vital signs and the nursing notes.   HISTORY  Chief Complaint Headache   HPI Joseph Joseph is a 25 y.o. male presenting to the emergency department for treatment and evaluation of headache that has been persistent for the past month.  Location: Left side Similar to previous headaches: No Duration: 1 month TIMING: Intermittent SEVERITY: 4 out of 10 QUALITY: Throbbing CONTEXT: No known exposure or trigger MODIFYING FACTORS: No relief with acetaminophen ASSOCIATED SYMPTOMS: Occasional photophobia History reviewed. No pertinent past medical history.  Patient Active Problem List   Diagnosis Date Noted   BMI 32.0-32.9,adult 11/01/2018    History reviewed. No pertinent surgical history.  Prior to Admission medications   Medication Sig Start Date End Date Taking? Authorizing Provider  ketorolac (TORADOL) 10 MG tablet Take 1 tablet (10 mg total) by mouth every 6 (six) hours as needed. 02/22/21  Yes Maccoy Haubner B, FNP  dicyclomine (BENTYL) 10 MG capsule Take 1 capsule (10 mg total) by mouth 3 (three) times daily as needed for spasms (abd cramping and spasms). 10/05/20 11/04/20  Delton Prairie, MD  Vitamin D, Ergocalciferol, (DRISDOL) 1.25 MG (50000 UT) CAPS capsule Take 1 capsule (50,000 Units total) by mouth every 7 (seven) days. 11/02/18   Tracey Harries, FNP    Allergies Rocephin [ceftriaxone]  History reviewed. No pertinent family history.  Social History Social History   Tobacco Use   Smoking status: Former   Smokeless tobacco: Never  Building services engineer Use: Never used  Substance Use Topics   Alcohol use: Yes    Comment: 4-5 drinks per week   Drug use: Never    Review of Systems Constitutional: No fever/chills or recent injury. Eyes: No visual changes. ENT: No sore  throat. Respiratory: Denies shortness of breath. Gastrointestinal: No abdominal pain.  No nausea, no vomiting.  No diarrhea.  No constipation. Musculoskeletal: Negative for pain. Skin: Negative for rash. Neurological:Positive for headache, negative for focal weakness or numbness. No confusion or fainting. ___________________________________________   PHYSICAL EXAM:  VITAL SIGNS: ED Triage Vitals  Enc Vitals Group     BP 02/21/21 2040 134/80     Pulse Rate 02/21/21 2040 87     Resp 02/21/21 2040 18     Temp 02/21/21 2040 98.4 F (36.9 C)     Temp Source 02/21/21 2040 Oral     SpO2 02/21/21 2040 96 %     Weight 02/21/21 2041 200 lb (90.7 kg)     Height 02/21/21 2041 5\' 9"  (1.753 m)     Head Circumference --      Peak Flow --      Pain Score 02/21/21 2041 3     Pain Loc --      Pain Edu? --      Excl. in GC? --     Constitutional: Alert and oriented. Well appearing and in no acute distress. Eyes: Conjunctivae are normal. PERRL. EOMI without expressed pain. No evidence of papilledema on limited exam. Head: Atraumatic. Nose: No congestion/rhinnorhea. Mouth/Throat: Mucous membranes are moist.  Oropharynx non-erythematous. Neck: No stridor. Supple, no meningismus.  Cardiovascular: Normal rate, regular rhythm. Grossly normal heart sounds.  Good peripheral circulation. Respiratory: Normal respiratory effort.  No retractions. Lungs CTAB. Gastrointestinal: Soft and nontender. No distention.  Musculoskeletal: No lower extremity tenderness nor edema.  No joint effusions. Neurologic:  Normal speech and language. No gross  focal neurologic deficits are appreciated. No gait instability. Cranial nerves: 2-10 normal as tested. Cerebellar:Normal Romberg, finger-nose-finger, heel to shin, normal gait. Sensorimotor: No aphasia, pronator drift, clonus, sensory loss or abnormal reflexes.  Skin:  Skin is warm, dry and intact. No rash noted. Psychiatric: Mood and affect are normal. Speech and  behavior are normal. Normal thought process and cognition.  ____________________________________________   LABS (all labs ordered are listed, but only abnormal results are displayed)  Labs Reviewed  CBC - Abnormal; Notable for the following components:      Result Value   WBC 11.0 (*)    All other components within normal limits  BASIC METABOLIC PANEL - Abnormal; Notable for the following components:   Glucose, Bld 107 (*)    All other components within normal limits   ____________________________________________  EKG  Not indicated. ____________________________________________  RADIOLOGY  CT HEAD WO CONTRAST ( )  Result Date: 02/21/2021 CLINICAL DATA:  Headache, new or worsening, neuro deficit (Age 89-49y) Headache x1 month that is getting progressively worse EXAM: CT HEAD WITHOUT CONTRAST TECHNIQUE: Contiguous axial images were obtained from the base of the skull through the vertex without intravenous contrast. COMPARISON:  None. FINDINGS: Brain: Normal anatomic configuration. No abnormal intra or extra-axial mass lesion or fluid collection. No abnormal mass effect or midline shift. No evidence of acute intracranial hemorrhage or infarct. Ventricular size is normal. Cerebellum unremarkable. Vascular: Unremarkable Skull: Intact Sinuses/Orbits: Paranasal sinuses are clear. Orbits are unremarkable. Other: Mastoid air cells and middle ear cavities are clear. IMPRESSION: No acute intracranial abnormality.  Normal examination. Electronically Signed   By: Helyn Numbers M.D.   On: 02/21/2021 21:10   ____________________________________________   PROCEDURES  Procedure(s) performed:  Procedures  Critical Care performed: None ____________________________________________   INITIAL IMPRESSION / ASSESSMENT AND PLAN / ED COURSE  25 year old male presenting to the emergency department for treatment and evaluation of headache.  See HPI for further details.  Exam is overall reassuring.   Labs are without concern.  CT negative for acute findings per radiology.  Patient given IM Toradol while in the emergency department.  Prescription for the same sent to the pharmacy.  Patient encouraged to follow-up with neurology.  If her symptoms change or worsen if he is unable to see primary care or the neurologist he is to return to the emergency department.  Pertinent labs & imaging results that were available during my care of the patient were reviewed by me and considered in my medical decision making (see chart for details). ____________________________________________   FINAL CLINICAL IMPRESSION(S) / ED DIAGNOSES  Final diagnoses:  Headache disorder    ED Discharge Orders          Ordered    ketorolac (TORADOL) 10 MG tablet  Every 6 hours PRN       Note to Pharmacy: Injection given in ER   02/22/21 0754             Chinita Pester, FNP 02/22/21 1191    Arnaldo Natal, MD 02/22/21 1547

## 2021-02-22 NOTE — Discharge Instructions (Signed)
Please follow up with neurology.  Return to the ER for symptoms that change or worsen if unable to see the specialist or primary care.

## 2021-02-22 NOTE — ED Notes (Signed)
Patient denies any changes to his symptoms.  Patient ambulatory back and forth to bathroom in triage without difficulty.  NAD noted.

## 2021-04-08 ENCOUNTER — Other Ambulatory Visit: Payer: Self-pay

## 2021-04-08 ENCOUNTER — Ambulatory Visit
Admission: EM | Admit: 2021-04-08 | Discharge: 2021-04-08 | Disposition: A | Discharge status: Home / Self Care | Payer: No Typology Code available for payment source

## 2021-04-08 DIAGNOSIS — K2100 Gastro-esophageal reflux disease with esophagitis, without bleeding: Secondary | ICD-10-CM

## 2021-04-08 DIAGNOSIS — R079 Chest pain, unspecified: Secondary | ICD-10-CM

## 2021-04-08 MED ORDER — PANTOPRAZOLE SODIUM 40 MG PO TBEC: 40.0000 mg | DELAYED_RELEASE_TABLET | Freq: Every day | ORAL | 2 refills | Status: DC

## 2021-04-08 NOTE — ED Provider Notes (Signed)
[facility]    CSN: [id number] Arrival date & time: 04/08/21  1500    HISTORY  No chief complaint on file.  HPI Joseph Joseph is a 25 y.o. male. Patient reports having pain in epigastric area that has not moved to upper chest. Patient states that the pain is like a burning sensation that only lasts a few minutes and resolves.  States this pain began a week ago.  EMR reviewed, patient has been to urgent care multiple times in the past 2 years with a chief complaint of chest pain, cardiac work-up that was unremarkable.  Patient states he has previously been prescribed pantoprazole for heartburn symptoms, states he is not currently taking this.  Patient states that he has a family history of cardiac disease, states he wants to get "checked out".  Patient localizes his pain to right upper quadrant immediately lateral to epigastric area and to the left of his mid sternum.  Patient also reports a history of anxiety, states he is previously been prescribed BuSpar for this.  The history is provided by the patient.  History reviewed. No pertinent past medical history. Patient Active Problem List   Diagnosis Date Noted   BMI 32.0-32.9,adult 11/01/2018   History reviewed. No pertinent surgical history.  Home Medications    Prior to Admission medications   Medication Sig Start Date End Date Taking? Authorizing Provider  busPIRone (BUSPAR) 7.5 MG tablet Take 7.5 mg by mouth 3 (three) times daily.   Yes [provider]  pantoprazole (PROTONIX) 20 MG tablet Take 20 mg by mouth daily.   Yes [provider]    Family History History reviewed. No pertinent family history. Social History Social History   Tobacco Use   Smoking status: Former   Smokeless tobacco: Never  Building services engineer Use: Never used  Substance Use Topics   Alcohol use: Yes    Comment: 4-5 drinks per week   Drug use: Never   Allergies   Rocephin [ceftriaxone]  Review of Systems Review  of Systems Pertinent findings noted in history of present illness.   Physical Exam Triage Vital Signs ED Triage Vitals  Enc Vitals Group     BP 02/20/21 0827 (!) 147/82     Pulse Rate 02/20/21 0827 72     Resp 02/20/21 0827 18     Temp 02/20/21 0827 98.3 F (36.8 C)     Temp Source 02/20/21 0827 Oral     SpO2 02/20/21 0827 98 %     Weight --      Height --      Head Circumference --      Peak Flow --      Pain Score 02/20/21 0826 5     Pain Loc --      Pain Edu? --      Excl. in GC? --   No data found.  Updated Vital Signs BP 109/74 (BP Location: Right Arm)    Pulse 68    Temp 98 F (36.7 C) (Oral)    Resp 18    SpO2 97%   Physical Exam  Visual Acuity Right Eye Distance:   Left Eye Distance:   Bilateral Distance:    Right Eye Near:   Left Eye Near:    Bilateral Near:     UC Couse / Diagnostics / Procedures:    EKG  Radiology No results found.  Procedures Procedures (including critical care time)  UC Diagnoses / Final Clinical Impressions(s)  I have reviewed the triage vital signs and the nursing notes.  Pertinent labs & imaging results that were available during my care of the patient were reviewed by me and considered in my medical decision making (see chart for details).    Final diagnoses:  Chest pain, unspecified type  Gastroesophageal reflux disease with esophagitis without hemorrhage   Patient advised begin pantoprazole, EKG essentially normal.  Follow-up with primary care 1 month.  ED Prescriptions     Medication Sig Dispense Auth. Provider   pantoprazole (PROTONIX) 40 MG tablet Take 1 tablet (40 mg total) by mouth daily. 30 tablet Theadora Rama Scales, PA-C      PDMP not reviewed this encounter.  Pending results:  Labs Reviewed - No data to display  Medications Ordered in UC: Medications - No data to display  Disposition Upon Discharge:  Condition: stable for discharge home Home: take medications as prescribed; routine discharge  instructions as discussed; follow up as advised.  Patient presented with an acute illness with associated systemic symptoms and significant discomfort requiring urgent management. In my opinion, this is a condition that a prudent lay person (someone who possesses an average knowledge of health and medicine) may potentially expect to result in complications if not addressed urgently such as respiratory distress, impairment of bodily function or dysfunction of bodily organs.   Routine symptom specific, illness specific and/or disease specific instructions were discussed with the patient and/or caregiver at length.   As such, the patient has been evaluated and assessed, work-up was performed and treatment was provided in alignment with urgent care protocols and evidence based medicine.  Patient/parent/caregiver has been advised that the patient may require follow up for further testing and treatment if the symptoms continue in spite of treatment, as clinically indicated and appropriate.  If the patient was tested for COVID-19, Influenza and/or RSV, then the patient/parent/guardian was advised to isolate at home pending the results of his/her diagnostic coronavirus test and potentially longer if theyre positive. I have also advised pt that if his/her COVID-19 test returns positive, it's recommended to self-isolate for at least 10 days after symptoms first appeared AND until fever-free for 24 hours without fever reducer AND other symptoms have improved or resolved. Discussed self-isolation recommendations as well as instructions for household member/close contacts as per the [facility] and [place] DHHS, and also gave patient the COVID packet with this information.  Patient/parent/caregiver has been advised to return to the [facility] or PCP in 3-5 days if no better; to PCP or the Emergency Department if new signs and symptoms develop, or if the current signs or symptoms continue to change or worsen for further workup, evaluation  and treatment as clinically indicated and appropriate  The patient will follow up with their current PCP if and as advised. If the patient does not currently have a PCP we will assist them in obtaining one.   The patient may need specialty follow up if the symptoms continue, in spite of conservative treatment and management, for further workup, evaluation, consultation and treatment as clinically indicated and appropriate.   Patient/parent/caregiver verbalized understanding and agreement of plan as discussed.  All questions were addressed during visit.  Please see discharge instructions below for further details of plan.  Discharge Instructions:   Discharge Instructions      Your EKG today is completely normal, I have no concerns for any cardiac dysfunction.  As I mentioned, the location of your pain is most likely related to uncontrolled gastroesophageal reflux disease and  can easily be treated with daily doses of pantoprazole 40 mg.  I recommend that you take it for the next 30 days and follow-up with your primary care provider to discuss this medications benefits and whether or not you should continue taking it.         Theadora Rama Scales, PA-C 04/08/21 [phone]

## 2021-04-08 NOTE — Discharge Instructions (Addendum)
Your EKG today is completely normal, I have no concerns for any cardiac dysfunction.  As I mentioned, the location of your pain is most likely related to uncontrolled gastroesophageal reflux disease and can easily be treated with daily doses of pantoprazole 40 mg.  I recommend that you take it for the next 30 days and follow-up with your primary care provider to discuss this medications benefits and whether or not you should continue taking it.

## 2021-04-08 NOTE — ED Triage Notes (Signed)
Patient reports having pain in epigastric area that has not moved to upper chest. Patient states that the pain is like a burning sensation that only lasts a few minutes and resolves.  Started: a week ago

## 2021-05-20 LAB — HM HEPATITIS C SCREENING LAB: HM Hepatitis Screen: NEGATIVE

## 2021-05-20 LAB — HM HIV SCREENING LAB: HM HIV Screening: NEGATIVE

## 2021-06-10 ENCOUNTER — Other Ambulatory Visit: Payer: Self-pay

## 2021-06-10 ENCOUNTER — Encounter: Payer: Self-pay | Admitting: Emergency Medicine

## 2021-06-10 ENCOUNTER — Ambulatory Visit
Admission: EM | Admit: 2021-06-10 | Discharge: 2021-06-10 | Disposition: A | Discharge status: Home / Self Care | Payer: No Typology Code available for payment source | Attending: Emergency Medicine | Admitting: Emergency Medicine

## 2021-06-10 DIAGNOSIS — B349 Viral infection, unspecified: Secondary | ICD-10-CM

## 2021-06-10 HISTORY — DX: Gastro-esophageal reflux disease without esophagitis: K21.9

## 2021-06-10 LAB — POCT RAPID STREP A (OFFICE): Rapid Strep A Screen: NEGATIVE

## 2021-06-10 NOTE — ED Triage Notes (Signed)
Pt presents with fever, cough and ST x 5 days.

## 2021-06-10 NOTE — ED Provider Notes (Signed)
Joseph Joseph    CSN: [id number] Arrival date & time: 06/10/21  1547      History   Chief Complaint Chief Complaint  Patient presents with   Fever   Cough   Sore Throat    HPI Joseph Joseph is a 26 y.o. male.  Patient presents with 2-3 day history of fever, sore throat, cough.  Tmax 102.  Treatment at home with Tylenol.  He denies rash, shortness of breath, vomiting, diarrhea, or other symptoms.  His medical history includes GERD.  The history is provided by the patient.   Past Medical History:  Diagnosis Date   GERD (gastroesophageal reflux disease)     Patient Active Problem List   Diagnosis Date Noted   BMI 32.0-32.9,adult 11/01/2018    History reviewed. No pertinent surgical history.     Home Medications    Prior to Admission medications   Medication Sig Start Date End Date Taking? Authorizing Provider  pantoprazole (PROTONIX) 40 MG tablet Take 1 tablet (40 mg total) by mouth daily. 04/08/21 07/07/21 Yes Theadora Rama Scales, PA-C  busPIRone (BUSPAR) 7.5 MG tablet Take 7.5 mg by mouth 3 (three) times daily.    [provider]    Family History History reviewed. No pertinent family history.  Social History Social History   Tobacco Use   Smoking status: Former   Smokeless tobacco: Never  Building services engineer Use: Never used  Substance Use Topics   Alcohol use: Yes    Comment: 4-5 drinks per week   Drug use: Never     Allergies   Rocephin [ceftriaxone]   Review of Systems Review of Systems  Constitutional:  Positive for fever. Negative for chills.  HENT:  Positive for sore throat. Negative for ear pain.   Respiratory:  Positive for cough. Negative for shortness of breath.   Cardiovascular:  Negative for chest pain and palpitations.  Gastrointestinal:  Negative for diarrhea and vomiting.  Skin:  Negative for color change and rash.  All other systems reviewed and are negative.   Physical Exam Triage Vital Signs ED  Triage Vitals [06/10/21 1600]  Enc Vitals Group     BP 114/72     Pulse Rate 87     Resp 18     Temp 99.5 F (37.5 C)     Temp src      SpO2 96 %     Weight      Height      Head Circumference      Peak Flow      Pain Score 0     Pain Loc      Pain Edu?      Excl. in GC?    No data found.  Updated Vital Signs BP 114/72    Pulse 87    Temp 99.5 F (37.5 C)    Resp 18    SpO2 96%   Visual Acuity Right Eye Distance:   Left Eye Distance:   Bilateral Distance:    Right Eye Near:   Left Eye Near:    Bilateral Near:     Physical Exam Vitals and nursing note reviewed.  Constitutional:      General: He is not in acute distress.    Appearance: Normal appearance. He is well-developed. He is not ill-appearing.  HENT:     Right Ear: Tympanic membrane normal.     Left Ear: Tympanic membrane normal.     Nose: Nose normal.  Mouth/Throat:     Mouth: Mucous membranes are moist.     Pharynx: Posterior oropharyngeal erythema present.  Cardiovascular:     Rate and Rhythm: Normal rate and regular rhythm.     Heart sounds: Normal heart sounds.  Pulmonary:     Effort: Pulmonary effort is normal. No respiratory distress.     Breath sounds: Normal breath sounds.  Musculoskeletal:     Cervical back: Neck supple.  Skin:    General: Skin is warm and dry.  Neurological:     Mental Status: He is alert.  Psychiatric:        Mood and Affect: Mood normal.        Behavior: Behavior normal.     UC Treatments / Results  Labs (all labs ordered are listed, but only abnormal results are displayed) Labs Reviewed  COVID-19, FLU A+B NAA  POCT RAPID STREP A (OFFICE)    EKG   Radiology No results found.  Procedures Procedures (including critical care time)  Medications Ordered in UC Medications - No data to display  Initial Impression / Assessment and Plan / UC Course  I have reviewed the triage vital signs and the nursing notes.  Pertinent labs & imaging results that were  available during my care of the patient were reviewed by me and considered in my medical decision making (see chart for details).    Viral illness.  Rapid strep negative.  COVID and Flu pending.  Instructed patient to self quarantine per CDC guidelines.  Discussed symptomatic treatment including Tylenol or ibuprofen, rest, hydration.  Instructed patient to follow up with PCP if symptoms are not improving.  Patient agrees to plan of care.   Final Clinical Impressions(s) / UC Diagnoses   Final diagnoses:  Viral illness     Discharge Instructions      Your strep test is negative.  Your COVID and Flu tests are pending.  You should self quarantine until the test results are back.    Take Tylenol or ibuprofen as needed for fever or discomfort.  Rest and keep yourself hydrated.    Follow-up with your primary care provider if your symptoms are not improving.         ED Prescriptions   None    PDMP not reviewed this encounter.   Mickie Bail, NP 06/10/21 [phone]

## 2021-06-10 NOTE — Discharge Instructions (Addendum)
Your strep test is negative.  Your COVID and Flu tests are pending.  You should self quarantine until the test results are back.    Take Tylenol or ibuprofen as needed for fever or discomfort.  Rest and keep yourself hydrated.    Follow-up with your primary care provider if your symptoms are not improving.     

## 2021-06-11 LAB — COVID-19, FLU A+B NAA
Influenza A, NAA: NOT DETECTED
Influenza B, NAA: NOT DETECTED
SARS-CoV-2, NAA: DETECTED — AB

## 2021-07-15 ENCOUNTER — Other Ambulatory Visit: Payer: Self-pay

## 2021-07-15 ENCOUNTER — Ambulatory Visit
Admission: EM | Admit: 2021-07-15 | Discharge: 2021-07-15 | Disposition: A | Discharge status: Home / Self Care | Payer: 59 | Attending: Emergency Medicine | Admitting: Emergency Medicine

## 2021-07-15 DIAGNOSIS — R002 Palpitations: Secondary | ICD-10-CM

## 2021-07-15 HISTORY — DX: Anxiety disorder, unspecified: F41.9

## 2021-07-15 NOTE — ED Provider Notes (Signed)
HPI ? ?SUBJECTIVE: ? ?Joseph Joseph is a 26 y.o. male who presents with 1 year of palpitations described as "my heart skipping a beat" accompanied with shortness of breath.  This lasts approximately a second and then resolves.  He states that it has become more frequent over the past several days-it is occurring daily, every hour or two.  No chest pain, pressure, heaviness, nausea, diaphoresis, lightheadedness, dizziness, syncope.  It occurs at home and at work.  He drinks caffeine once a week.  No unintentional weight loss, temperature intolerance, hair, skin, nail changes. ? ?Patient presented to the ED in December 21 with chest pain, and ED work-up was unremarkable.  He presented to an urgent care 08/01/2020 with chest pain, and was advised to go to the ED for further evaluation.  He presented here with epigastric pain in December 22, and was thought to have GERD.   ? ?Past Medical History:  ?Diagnosis Date  ? Anxiety   ? GERD (gastroesophageal reflux disease)   ? ? ?History reviewed. No pertinent surgical history. ? ?History reviewed. No pertinent family history. ? ?Social History  ? ?Tobacco Use  ? Smoking status: Former  ? Smokeless tobacco: Never  ?Vaping Use  ? Vaping Use: Never used  ?Substance Use Topics  ? Alcohol use: Not Currently  ?  Comment: 4-5 drinks per week  ? Drug use: Never  ? ? ?No current facility-administered medications for this encounter. ? ?Current Outpatient Medications:  ?  busPIRone (BUSPAR) 7.5 MG tablet, Take 7.5 mg by mouth 3 (three) times daily., Disp: , Rfl:  ?  pantoprazole (PROTONIX) 40 MG tablet, Take 1 tablet (40 mg total) by mouth daily., Disp: 30 tablet, Rfl: 2 ? ?Allergies  ?Allergen Reactions  ? Rocephin [Ceftriaxone] Hives  ? ? ? ?ROS ? ?As noted in HPI.  ? ?Physical Exam ? ?BP 124/73 (BP Location: Left Arm)   Pulse 81   Temp 98.9 ?F (37.2 ?C) (Oral)   Resp 18   SpO2 98%  ? ?Constitutional: Well developed, well nourished, no acute distress ?Eyes:  EOMI, conjunctiva  normal bilaterally ?HENT: Normocephalic, atraumatic,mucus membranes moist ?Respiratory: Normal inspiratory effort, lungs clear bilaterally ?Cardiovascular: Normal rate, regular rhythm, no murmurs, rubs, gallops ?Neck: No thyromegaly, thyroid tenderness ?GI: nondistended ?skin: No rash, skin intact ?Musculoskeletal: no deformities ?Neurologic: Alert & oriented x 3, no focal neuro deficits ?Psychiatric: Speech and behavior appropriate ? ? ?ED Course ? ? ?Medications - No data to display ? ?Orders Placed This Encounter  ?Procedures  ? Ambulatory referral to Cardiology  ?  Referral Priority:   Routine  ?  Referral Type:   Consultation  ?  Referral Reason:   Specialty Services Required  ?  Requested Specialty:   Cardiology  ?  Number of Visits Requested:   1  ? ED EKG  ?  Standing Status:   Standing  ?  Number of Occurrences:   1  ?  Order Specific Question:   Reason for Exam  ?  Answer:   Other (See Comments)  ?  Order Specific Question:   Release to patient  ?  Answer:   Immediate  ? ? ?No results found for this or any previous visit (from the past 24 hour(s)). ?No results found. ? ?ED Clinical Impression ? ?1. Palpitations   ?  ? ?ED Assessment/Plan ? ?Previous outside records reviewed.  As noted in HPI ? ?EKG: Normal sinus rhythm, rate 71.  Normal axis, normal intervals.  No hypertrophy.  T wave inversion in V2, new from EKG on 12/22, there are no other ST-T wave changes.  Patient was asymptomatic while EKG was obtained ? ?EKG is unremarkable.  Patient is describing palpitations accompanied with some shortness of breath that lasts approximately a second that are coming more frequently.  His vitals are normal, does not appear to be grossly hyperthyroid.  Patient has not yet received cardiac evaluation for this.  Will place outpatient referral to cardiology for further evaluation and management. discussed ER return precautions with patient.  Patient agrees with plan.  ? ?No orders of the defined types were placed in  this encounter. ? ? ? ? ?*This clinic note was created using Scientist, clinical (histocompatibility and immunogenetics). Therefore, there may be occasional mistakes despite careful proofreading. ? ?? ? ?  ?Domenick Gong, MD ?07/16/21 1311 ? ?

## 2021-07-15 NOTE — Discharge Instructions (Addendum)
Your EKG and physical exam were unremarkable today.  I have placed a referral to cardiology for further evaluation.  You may need to monitor to identify these palpitations.  Continue the BuSpar multivitamin for now.  Go immediately to the ED for the signs and symptoms we discussed ?

## 2021-07-15 NOTE — ED Triage Notes (Signed)
Pt reports feeling like his heart is skipping a beat for approx 2 months, was occurring every couple days. Now happening every couple hours.  Feels the 'skip' then has to catch breath and it resolves within a couple seconds.  No dizziness, nausea, chest pain, radiation of pain.   ?

## 2021-07-17 ENCOUNTER — Other Ambulatory Visit: Payer: Self-pay

## 2021-07-17 ENCOUNTER — Emergency Department
Admission: EM | Admit: 2021-07-17 | Discharge: 2021-07-17 | Disposition: A | Discharge status: Home / Self Care | Payer: 59 | Attending: Emergency Medicine | Admitting: Emergency Medicine

## 2021-07-17 ENCOUNTER — Emergency Department: Payer: 59

## 2021-07-17 DIAGNOSIS — R002 Palpitations: Secondary | ICD-10-CM | POA: Insufficient documentation

## 2021-07-17 LAB — BASIC METABOLIC PANEL
Anion gap: 10 (ref 5–15)
BUN: 12 mg/dL (ref 6–20)
CO2: 23 mmol/L (ref 22–32)
Calcium: 9 mg/dL (ref 8.9–10.3)
Chloride: 104 mmol/L (ref 98–111)
Creatinine, Ser: 0.86 mg/dL (ref 0.61–1.24)
GFR, Estimated: >60 mL/min (ref >60)
Glucose, Bld: 126 mg/dL — ABNORMAL HIGH (ref 70–99)
Potassium: 3.8 mmol/L (ref 3.5–5.1)
Sodium: 137 mmol/L (ref 135–145)

## 2021-07-17 LAB — CBC
HCT: 44.9 % (ref 39.0–52.0)
Hemoglobin: 14.7 g/dL (ref 13.0–17.0)
MCH: 30.6 pg (ref 26.0–34.0)
MCHC: 32.7 g/dL (ref 30.0–36.0)
MCV: 93.3 fL (ref 80.0–100.0)
Platelets: 294 10*3/uL (ref 150–400)
RBC: 4.81 MIL/uL (ref 4.22–5.81)
RDW: 13.3 % (ref 11.5–15.5)
WBC: 9.8 10*3/uL (ref 4.0–10.5)
nRBC: 0 % (ref 0.0–0.2)

## 2021-07-17 LAB — TROPONIN I (HIGH SENSITIVITY): Troponin I (High Sensitivity): 4 ng/L (ref <18)

## 2021-07-17 MED ORDER — PROPRANOLOL HCL 20 MG PO TABS: 20.0000 mg | ORAL_TABLET | Freq: Three times a day (TID) | ORAL | 0 refills | Status: DC | PRN

## 2021-07-17 NOTE — ED Provider Notes (Signed)
? ?  [facility] ?Provider Note ? ? ? Event Date/Time  ? First MD Initiated Contact with Patient 07/17/21 1649   ?  (approximate) ? ? ?History  ? ?Palpitations ? ? ?HPI ? ?Joseph Joseph is a 26 y.o. male  with a h/o anxiety, GERD, who comes to the ED c/o palpitations, occurring about every 30 minutes, lasts for 1 second. No aggravating/alleviating factors. Not exertional/pleuritic. No sob, diaphoresis, or vomiting. No syncope or exertional symptoms.  ? ?  ? ? ?Physical Exam  ? ?Triage Vital Signs: ?ED Triage Vitals [07/17/21 1542]  ?Enc Vitals Group  ?   BP 131/62  ?   Pulse Rate 86  ?   Resp 19  ?   Temp 97.8 ?F (36.6 ?C)  ?   Temp Source Oral  ?   SpO2 96 %  ?   Weight   ?   Height 5\' 9"  (1.753 m)  ?   Head Circumference   ?   Peak Flow   ?   Pain Score 0  ?   Pain Loc   ?   Pain Edu?   ?   Excl. in GC?   ? ? ?Most recent vital signs: ?Vitals:  ? 07/17/21 1542  ?BP: 131/62  ?Pulse: 86  ?Resp: 19  ?Temp: 97.8 ?F (36.6 ?C)  ?SpO2: 96%  ? ? ? ?General: Awake, no distress.  ?CV:  Good peripheral perfusion. RRR ?Resp:  Normal effort. CTAB ?Abd:  No distention.  ?Other:  No LE edema/tenderness. Thyroid nonpalpable ? ? ?ED Results / Procedures / Treatments  ? ?Labs ?(all labs ordered are listed, but only abnormal results are displayed) ?Labs Reviewed  ?BASIC METABOLIC PANEL - Abnormal; Notable for the following components:  ?    Result Value  ? Glucose, Bld 126 (*)   ? All other components within normal limits  ?CBC  ?TROPONIN I (HIGH SENSITIVITY)  ? ? ? ?EKG ? ?Interpreted by me ?Normal sinus rhythm, rate of 90. Nl axis, intervals, QRS, ST segments, and T waves. ? ? ?RADIOLOGY ?CXR viewed and itnerpreted by me, appears normal. Radiology report reviewed ? ? ? ?PROCEDURES: ? ?Critical Care performed: No ? ?Procedures ? ? ?MEDICATIONS ORDERED IN ED: ?Medications - No data to display ? ? ?IMPRESSION / MDM / ASSESSMENT AND PLAN / ED COURSE  ?I reviewed the triage vital signs and the nursing notes. ?              ?               ? ?Differential diagnosis includes, but is not limited to, anxiety, palpitations, ptx, chest wall muscle spasm ? ? ?Pt p/w sporadic palpitation sensation. Ekg, cxr, and labs are all normal. Does not have cardiac sx at this time and does not require further cardiac workup. Doubt PE, dissection, pericarditis, or esophageal rupture. ? ?  ? ? ?FINAL CLINICAL IMPRESSION(S) / ED DIAGNOSES  ? ?Final diagnoses:  ?Palpitations  ? ? ? ?Rx / DC Orders  ? ?ED Discharge Orders   ? ?      Ordered  ?  propranolol (INDERAL) 20 MG tablet  3 times daily PRN       ? 07/17/21 1705  ? ?  ?  ? ?  ? ? ? ?Note:  This document was prepared using Dragon voice recognition software and may include unintentional dictation errors. ?  ?07/19/21, MD ?07/17/21 1710 ? ?

## 2021-07-17 NOTE — ED Triage Notes (Signed)
Patient to ER via POV with complaints of palpitations/ his feeling like his heart is skipping a beat for approx 2 months. Reports some pain/ "a knot" on the left side when he feels the palpitations as well as some shortness of breath. ? ?Reports having an EKG done yesterday that was non-remarkable.  ?

## 2021-07-31 ENCOUNTER — Ambulatory Visit: Payer: Self-pay

## 2021-07-31 ENCOUNTER — Encounter: Payer: Self-pay | Admitting: Cardiology

## 2021-07-31 ENCOUNTER — Ambulatory Visit (INDEPENDENT_AMBULATORY_CARE_PROVIDER_SITE_OTHER): Payer: 59 | Admitting: Cardiology

## 2021-07-31 VITALS — BP 108/62 | HR 58 | Ht 69.0 in | Wt 197.0 lb

## 2021-07-31 DIAGNOSIS — I499 Cardiac arrhythmia, unspecified: Secondary | ICD-10-CM

## 2021-07-31 DIAGNOSIS — R002 Palpitations: Secondary | ICD-10-CM

## 2021-07-31 DIAGNOSIS — K219 Gastro-esophageal reflux disease without esophagitis: Secondary | ICD-10-CM

## 2021-07-31 NOTE — Progress Notes (Signed)
?Cardiology Office Note:   ? ?Date:  07/31/2021  ? ?ID:  Joseph Joseph, DOB [birth year], MRN [id number] ? ?PCP:  Felix Pacini, FNP ?  ?[facility] Providers ?Cardiologist:  Debbe Odea, MD    ? ?Referring MD: Domenick Gong, MD  ? ?Chief Complaint  ?Patient presents with  ? New Patient (Initial Visit)  ?  Referred by PCP for Palpations. Meds reviewed verbally with patient.   ? ?Joseph Joseph is a 26 y.o. male who is being seen today for the evaluation of palpitations at the request of Domenick Gong, MD. ? ? ?History of Present Illness:   ? ?Kerney Hopfensperger is a 26 y.o. male with a hx of anxiety, GERD, former smoker x7 years who presents due to palpitations.  Patient complains of irregular/skipped heartbeats ongoing over the past 2 months.  Frequency seems to have increased.  Symptoms of irregular and skipped heartbeats now occur almost daily lasting about 30 minutes.  He denies dizziness, syncope, chest pain, shortness of breath.  Feels well otherwise.  Takes Protonix for GERD. ? ?Past Medical History:  ?Diagnosis Date  ? Anxiety   ? GERD (gastroesophageal reflux disease)   ? ? ?History reviewed. No pertinent surgical history. ? ?Current Medications: ?Current Meds  ?Medication Sig  ? busPIRone (BUSPAR) 7.5 MG tablet Take 7.5 mg by mouth 3 (three) times daily.  ? pantoprazole (PROTONIX) 40 MG tablet Take 1 tablet (40 mg total) by mouth daily.  ?  ? ?Allergies:   Rocephin [ceftriaxone]  ? ?Social History  ? ?Socioeconomic History  ? Marital status: Single  ?  Spouse name: Not on file  ? Number of children: Not on file  ? Years of education: Not on file  ? Highest education level: Not on file  ?Occupational History  ? Not on file  ?Tobacco Use  ? Smoking status: Former  ? Smokeless tobacco: Never  ?Vaping Use  ? Vaping Use: Never used  ?Substance and Sexual Activity  ? Alcohol use: Not Currently  ?  Comment: 4-5 drinks per week  ? Drug use: Never  ? Sexual activity: Not on file  ?Other Topics  Concern  ? Not on file  ?Social History Narrative  ? Not on file  ? ?Social Determinants of Health  ? ?Financial Resource Strain: Not on file  ?Food Insecurity: Not on file  ?Transportation Needs: Not on file  ?Physical Activity: Not on file  ?Stress: Not on file  ?Social Connections: Not on file  ?  ? ?Family History: ?The patient's family history is not on file. ? ?ROS:   ?Please see the history of present illness.    ? All other systems reviewed and are negative. ? ?EKGs/Labs/Other Studies Reviewed:   ? ?The following studies were reviewed today: ? ? ?EKG:  EKG is  ordered today.  The ekg ordered today demonstrates sinus bradycardia, otherwise normal ECG, heart rate 58. ? ?Recent Labs: ?10/05/2020: ALT 15 ?07/17/2021: BUN 12; Creatinine, Ser 0.86; Hemoglobin 14.7; Platelets 294; Potassium 3.8; Sodium 137  ?Recent Lipid Panel ?   ?Component Value Date/Time  ? CHOL 158 11/01/2018 1159  ? TRIG 52.0 11/01/2018 1159  ? HDL 50.70 11/01/2018 1159  ? CHOLHDL 3 11/01/2018 1159  ? VLDL 10.4 11/01/2018 1159  ? LDLCALC 97 11/01/2018 1159  ? ? ? ?Risk Assessment/Calculations:   ? ?    ? ?Physical Exam:   ? ?VS:  BP 108/62 (BP Location: Left Arm, Patient Position: Sitting, Cuff Size: Normal)   Pulse Marland Kitchen)  58   Ht 5\' 9"  (1.753 m)   Wt 197 lb (89.4 kg)   BMI 29.09 kg/m?    ? ?Wt Readings from Last 3 Encounters:  ?07/31/21 197 lb (89.4 kg)  ?02/21/21 200 lb (90.7 kg)  ?10/05/20 230 lb (104.3 kg)  ?  ? ?GEN:  Well nourished, well developed in no acute distress ?HEENT: Normal ?NECK: No JVD; No carotid bruits ?LYMPHATICS: No lymphadenopathy ?CARDIAC: RRR, no murmurs, rubs, gallops ?RESPIRATORY:  Clear to auscultation without rales, wheezing or rhonchi  ?ABDOMEN: Soft, non-tender, non-distended ?MUSCULOSKELETAL:  No edema; No deformity  ?SKIN: Warm and dry ?NEUROLOGIC:  Alert and oriented x 3 ?PSYCHIATRIC:  Normal affect  ? ?ASSESSMENT:   ? ?1. Irregular heart beats   ?2. Gastroesophageal reflux disease without esophagitis   ? ?PLAN:    ? ?In order of problems listed above: ? ?Palpitations, heartbeat irregularity, place cardiac monitor x2 weeks to evaluate any significant arrhythmias. ?History of GERD, continue Protonix. ? ?Follow-up after cardiac monitor. ? ?   ? ? ?Medication Adjustments/Labs and Tests Ordered: ?Current medicines are reviewed at length with the patient today.  Concerns regarding medicines are outlined above.  ?Orders Placed This Encounter  ?Procedures  ? LONG TERM MONITOR (3-14 DAYS)  ? EKG 12-Lead  ? ?No orders of the defined types were placed in this encounter. ? ? ?Patient Instructions  ?Medication Instructions:  ?Your physician recommends that you continue on your current medications as directed. Please refer to the Current Medication list given to you today. ? ?*If you need a refill on your cardiac medications before your next appointment, please call your pharmacy* ? ? ?Lab Work: ?None ordered ?If you have labs (blood work) drawn today and your tests are completely normal, you will receive your results only by: ?MyChart Message (if you have MyChart) OR ?A paper copy in the mail ?If you have any lab test that is abnormal or we need to change your treatment, we will call you to review the results. ? ? ?Testing/Procedures: ?Your provider has ordered a heart monitor to wear for 14 days. This will be mailed to your home with instructions on placement. Once you have finished the time frame requested, you will return monitor in box provided. ? ?  ? ? ?Follow-Up: ?At [facility], you and your health needs are our priority.  As part of our continuing mission to provide you with exceptional heart care, we have created designated Provider Care Teams.  These Care Teams include your primary Cardiologist (physician) and Advanced Practice Providers (APPs -  Physician Assistants and Nurse Practitioners) who all work together to provide you with the care you need, when you need it. ? ?We recommend signing up for the patient portal  called "MyChart".  Sign up information is provided on this After Visit Summary.  MyChart is used to connect with patients for Virtual Visits (Telemedicine).  Patients are able to view lab/test results, encounter notes, upcoming appointments, etc.  Non-urgent messages can be sent to your provider as well.   ?To learn more about what you can do with MyChart, go to [facility].   ? ?Your next appointment:   ?5-6 week(s) ? ?The format for your next appointment:   ?In Person ? ?Provider:   ?You may see [email or web address], MD or one of the following Advanced Practice Providers on your designated Care Team:   ?Debbe Odea, NP ?Nicolasa Ducking, PA-C ?Cadence Eula Listen, PA-C ? ? ?Other Instructions ?N/A ?  ? ?Signed, ?Fransico Michael,  MD  ?07/31/2021 12:54 PM    ?[facility] ?

## 2021-07-31 NOTE — Patient Instructions (Signed)
Medication Instructions:  ?Your physician recommends that you continue on your current medications as directed. Please refer to the Current Medication list given to you today. ? ?*If you need a refill on your cardiac medications before your next appointment, please call your pharmacy* ? ? ?Lab Work: ?None ordered ?If you have labs (blood work) drawn today and your tests are completely normal, you will receive your results only by: ?MyChart Message (if you have MyChart) OR ?A paper copy in the mail ?If you have any lab test that is abnormal or we need to change your treatment, we will call you to review the results. ? ? ?Testing/Procedures: ?Your provider has ordered a heart monitor to wear for 14 days. This will be mailed to your home with instructions on placement. Once you have finished the time frame requested, you will return monitor in box provided. ? ?  ? ? ?Follow-Up: ?At [facility], you and your health needs are our priority.  As part of our continuing mission to provide you with exceptional heart care, we have created designated Provider Care Teams.  These Care Teams include your primary Cardiologist (physician) and Advanced Practice Providers (APPs -  Physician Assistants and Nurse Practitioners) who all work together to provide you with the care you need, when you need it. ? ?We recommend signing up for the patient portal called "MyChart".  Sign up information is provided on this After Visit Summary.  MyChart is used to connect with patients for Virtual Visits (Telemedicine).  Patients are able to view lab/test results, encounter notes, upcoming appointments, etc.  Non-urgent messages can be sent to your provider as well.   ?To learn more about what you can do with MyChart, go to [email or web address].   ? ?Your next appointment:   ?5-6 week(s) ? ?The format for your next appointment:   ?In Person ? ?Provider:   ?You may see Kate Sable, MD or one of the following Advanced Practice Providers  on your designated Care Team:   ?Murray Hodgkins, NP ?Christell Faith, PA-C ?Cadence Kathlen Mody, PA-C ? ? ?Other Instructions ?N/A ? ?

## 2021-08-05 ENCOUNTER — Encounter: Payer: Self-pay | Admitting: Emergency Medicine

## 2021-08-05 ENCOUNTER — Other Ambulatory Visit: Payer: Self-pay

## 2021-08-05 ENCOUNTER — Emergency Department
Admission: EM | Admit: 2021-08-05 | Discharge: 2021-08-05 | Disposition: A | Discharge status: Home / Self Care | Payer: Self-pay | Attending: Emergency Medicine | Admitting: Emergency Medicine

## 2021-08-05 ENCOUNTER — Telehealth: Payer: Self-pay | Admitting: Cardiology

## 2021-08-05 DIAGNOSIS — I4891 Unspecified atrial fibrillation: Secondary | ICD-10-CM | POA: Insufficient documentation

## 2021-08-05 DIAGNOSIS — Z87891 Personal history of nicotine dependence: Secondary | ICD-10-CM | POA: Insufficient documentation

## 2021-08-05 LAB — BASIC METABOLIC PANEL
Anion gap: 7 (ref 5–15)
BUN: 14 mg/dL (ref 6–20)
CO2: 25 mmol/L (ref 22–32)
Calcium: 9.4 mg/dL (ref 8.9–10.3)
Chloride: 105 mmol/L (ref 98–111)
Creatinine, Ser: 0.93 mg/dL (ref 0.61–1.24)
GFR, Estimated: >60 mL/min (ref >60)
Glucose, Bld: 93 mg/dL (ref 70–99)
Potassium: 5 mmol/L (ref 3.5–5.1)
Sodium: 137 mmol/L (ref 135–145)

## 2021-08-05 LAB — CBC WITH DIFFERENTIAL/PLATELET
Abs Immature Granulocytes: 0.03 10*3/uL (ref 0.00–0.07)
Basophils Absolute: 0.1 10*3/uL (ref 0.0–0.1)
Basophils Relative: 1 %
Eosinophils Absolute: 0.2 10*3/uL (ref 0.0–0.5)
Eosinophils Relative: 3 %
HCT: 47.4 % (ref 39.0–52.0)
Hemoglobin: 15.5 g/dL (ref 13.0–17.0)
Immature Granulocytes: 0 %
Lymphocytes Relative: 35 %
Lymphs Abs: 2.8 10*3/uL (ref 0.7–4.0)
MCH: 30.5 pg (ref 26.0–34.0)
MCHC: 32.7 g/dL (ref 30.0–36.0)
MCV: 93.1 fL (ref 80.0–100.0)
Monocytes Absolute: 0.8 10*3/uL (ref 0.1–1.0)
Monocytes Relative: 10 %
Neutro Abs: 4.1 10*3/uL (ref 1.7–7.7)
Neutrophils Relative %: 51 %
Platelets: 249 10*3/uL (ref 150–400)
RBC: 5.09 MIL/uL (ref 4.22–5.81)
RDW: 13.2 % (ref 11.5–15.5)
WBC: 8 10*3/uL (ref 4.0–10.5)
nRBC: 0 % (ref 0.0–0.2)

## 2021-08-05 LAB — TROPONIN I (HIGH SENSITIVITY): Troponin I (High Sensitivity): 6 ng/L (ref <18)

## 2021-08-05 LAB — MAGNESIUM: Magnesium: 2.2 mg/dL (ref 1.7–2.4)

## 2021-08-05 LAB — TSH: TSH: 1.629 u[IU]/mL (ref 0.350–4.500)

## 2021-08-05 MED ORDER — DILTIAZEM LOAD VIA INFUSION
10.0000 mg | Freq: Once | INTRAVENOUS | Status: DC
Start: 2021-08-05 — End: 2021-08-05
  Filled 2021-08-05: qty 10

## 2021-08-05 MED ORDER — DILTIAZEM HCL 25 MG/5ML IV SOLN
20.0000 mg | Freq: Once | INTRAVENOUS | Status: AC
  Administered 2021-08-05: 20 mg via INTRAVENOUS
  Filled 2021-08-05: qty 5

## 2021-08-05 MED ORDER — APIXABAN 5 MG PO TABS: 5.0000 mg | ORAL_TABLET | Freq: Two times a day (BID) | ORAL | 0 refills | Status: DC

## 2021-08-05 MED ORDER — ETOMIDATE 2 MG/ML IV SOLN: 0.1000 mg/kg | Freq: Once | INTRAVENOUS | Status: DC

## 2021-08-05 MED ORDER — FENTANYL CITRATE PF 50 MCG/ML IJ SOSY
50.0000 ug | PREFILLED_SYRINGE | Freq: Once | INTRAMUSCULAR | Status: AC
  Administered 2021-08-05: 50 ug via INTRAVENOUS
  Filled 2021-08-05: qty 1

## 2021-08-05 MED ORDER — ETOMIDATE 2 MG/ML IV SOLN: 0.3000 mg/kg | Freq: Once | INTRAVENOUS | Status: DC

## 2021-08-05 MED ORDER — METOPROLOL SUCCINATE ER 50 MG PO TB24: 50.0000 mg | ORAL_TABLET | Freq: Every day | ORAL | 0 refills | Status: DC

## 2021-08-05 MED ORDER — LACTATED RINGERS IV BOLUS
1000.0000 mL | Freq: Once | INTRAVENOUS | Status: AC
  Administered 2021-08-05: 1000 mL via INTRAVENOUS

## 2021-08-05 MED ORDER — DILTIAZEM HCL-DEXTROSE 125-5 MG/125ML-% IV SOLN (PREMIX): 5.0000 mg/h | INTRAVENOUS | Status: DC

## 2021-08-05 MED ORDER — ETOMIDATE 2 MG/ML IV SOLN
0.2000 mg/kg | Freq: Once | INTRAVENOUS | Status: AC
  Administered 2021-08-05: 17.7 mg via INTRAVENOUS
  Filled 2021-08-05: qty 10

## 2021-08-05 NOTE — Telephone Encounter (Signed)
?  Pt went to ED today due to Afib, they remove his heart monitor and he said they shocked his heart to put back in rhythm. He wanted to speak with a nurse ?

## 2021-08-05 NOTE — ED Provider Notes (Signed)
? ?[facility] ?Provider Note ? ? ? Event Date/Time  ? First MD Initiated Contact with Patient 08/05/21 0746   ?  (approximate) ? ? ?History  ? ?Irregular Heart Beat (/) ? ? ?HPI ? ?Joseph Joseph is a 26 y.o. male  hx of anxiety, GERD, former smoker x7 years and previous episodes of intermittent palpitations currently on propranolol in the evening or as needed for tachycardia prescribed a recent ED visit from 3/24 having follow-up with cardiology on 4/7 and started on Zio patch presents for evaluation of palpitations that started when he woke up this morning.  States he felt normal state health when he went to sleep last night.  He feels that he is little lightheaded and that the palpitations have never been as strong.  He denies any chest pain, cough, fever, shortness of breath, headache, earache, sore throat, abdominal pain, vomiting, diarrhea, rash or extremity pain.  No recent falls or injuries.  Denies any significant caffeine use, illicit drug use, tobacco abuse or any other acute concerns.  He has no known history of A-fib.  He is not on any blood thinners. ? ?  ? ? ?Physical Exam  ?Triage Vital Signs: ?ED Triage Vitals [08/05/21 0743]  ?Enc Vitals Group  ?   BP   ?   Pulse   ?   Resp   ?   Temp   ?   Temp src   ?   SpO2   ?   Weight 195 lb (88.5 kg)  ?   Height 5\' 9"  (1.753 m)  ?   Head Circumference   ?   Peak Flow   ?   Pain Score 0  ?   Pain Loc   ?   Pain Edu?   ?   Excl. in GC?   ? ? ?Most recent vital signs: ?Vitals:  ? 08/05/21 0900 08/05/21 0905  ?BP: 115/82 128/69  ?Pulse: 65 76  ?Resp: 12 16  ?Temp:    ?SpO2: 100% 100%  ? ? ?General: Awake, no distress.  ?CV:  Good peripheral perfusion.  2+ radial pulses.  Tachycardic and regular.  No murmur. ?Resp:  Normal effort.  Clear bilaterally. ?Abd:  No distention.  Soft. ?Other:   ? ? ?ED Results / Procedures / Treatments  ?Labs ?(all labs ordered are listed, but only abnormal results are displayed) ?Labs Reviewed  ?CBC WITH  DIFFERENTIAL/PLATELET  ?BASIC METABOLIC PANEL  ?MAGNESIUM  ?TSH  ?TROPONIN I (HIGH SENSITIVITY)  ? ? ? ?EKG ? ?EKG is normal for A-fib with a ventricular rate of 129, normal axis, unremarkable intervals without evidence of acute ischemia or significant arrhythmia ? ? ?RADIOLOGY ? ? ? ?PROCEDURES: ? ?Critical Care performed: Yes, see critical care procedure note(s) ? ?.Critical Care ?Performed by: 10/05/21, MD ?Authorized by: Gilles Chiquito, MD  ? ?Critical care provider statement:  ?  Critical care time (minutes):  30 ?  Critical care was necessary to treat or prevent imminent or life-threatening deterioration of the following conditions:  Circulatory failure ?  Critical care was time spent personally by me on the following activities:  Development of treatment plan with patient or surrogate, discussions with consultants, evaluation of patient's response to treatment, examination of patient, ordering and review of laboratory studies, ordering and review of radiographic studies, ordering and performing treatments and interventions, pulse oximetry, re-evaluation of patient's condition and review of old charts ?.Sedation ? ?Date/Time: 08/05/2021 8:53 AM ?Performed by: 10/05/2021  P, MD ?Authorized by: Gilles Chiquito, MD  ? ?Consent:  ?  Consent obtained:  Verbal and written ?  Consent given by:  Patient ?  Risks discussed:  Allergic reaction, dysrhythmia, inadequate sedation, nausea, vomiting, respiratory compromise necessitating ventilatory assistance and intubation, prolonged sedation necessitating reversal and prolonged hypoxia resulting in organ damage ?Universal protocol:  ?  Immediately prior to procedure, a time out was called: yes   ?  Patient identity confirmed:  Arm band ?Indications:  ?  Procedure performed:  Cardioversion ?Pre-sedation assessment:  ?  Time since last food or drink:  N/A ?  NPO status caution: unable to specify NPO status   ?  ASA classification: class 1 - normal, healthy  patient   ?  Mallampati score:  I - soft palate, uvula, fauces, pillars visible ?  Neck mobility: normal   ?  Pre-sedation assessments completed and reviewed: airway patency, cardiovascular function, hydration status, mental status, nausea/vomiting, pain level, respiratory function and temperature   ?Immediate pre-procedure details:  ?  Reassessment: Patient reassessed immediately prior to procedure   ?  Reviewed: vital signs   ?  Verified: bag valve mask available, emergency equipment available, intubation equipment available, IV patency confirmed, oxygen available, reversal medications available and suction available   ?Procedure details (see MAR for exact dosages):  ?  Sedation:  Etomidate ?  Intended level of sedation: deep ?  Analgesia:  Fentanyl ?  Intra-procedure monitoring:  Blood pressure monitoring, cardiac monitor, continuous pulse oximetry and continuous capnometry ?  Intra-procedure events: none   ?  Total Provider sedation time (minutes):  15 ?Post-procedure details:  ?  Attendance: Constant attendance by certified staff until patient recovered   ?  Recovery: Patient returned to pre-procedure baseline   ?  Post-sedation assessments completed and reviewed: airway patency, cardiovascular function, hydration status, mental status, nausea/vomiting, pain level, respiratory function and temperature   ?  Patient is stable for discharge or admission: yes   ?  Procedure completion:  Tolerated well, no immediate complications ?.Cardioversion ? ?Date/Time: 08/05/2021 8:54 AM ?Performed by: Gilles Chiquito, MD ?Authorized by: Gilles Chiquito, MD  ? ?Consent:  ?  Consent obtained:  Verbal ?  Consent given by:  Patient ?  Risks discussed:  Pain, induced arrhythmia and cutaneous burn ?Pre-procedure details:  ?  Cardioversion basis:  Elective ?  Rhythm:  Atrial fibrillation ?Patient sedated: Yes. Refer to sedation procedure documentation for details of sedation. ? ?Attempt one:  ?  Cardioversion mode:  Synchronous ?   Waveform:  Biphasic ?  Shock (Joules):  100 ?  Shock outcome:  Conversion to normal sinus rhythm ?Post-procedure details:  ?  Patient status:  Awake ?  Patient tolerance of procedure:  Tolerated well, no immediate complications ? ? ? ?MEDICATIONS ORDERED IN ED: ?Medications  ?diltiazem (CARDIZEM) injection 20 mg (20 mg Intravenous Given 08/05/21 0807)  ?lactated ringers bolus 1,000 mL (0 mLs Intravenous Stopped 08/05/21 0859)  ?fentaNYL (SUBLIMAZE) injection 50 mcg (50 mcg Intravenous Given 08/05/21 0840)  ?etomidate (AMIDATE) injection 17.7 mg (17.7 mg Intravenous Given 08/05/21 0840)  ? ? ? ?IMPRESSION / MDM / ASSESSMENT AND PLAN / ED COURSE  ?I reviewed the triage vital signs and the nursing notes. ?             ?               ? ?Differential diagnosis includes, but is not limited to A-fib secondary to anemia, electrolyte derangements, thyrotoxicosis  with lower suspicion for PE as patient has no chest pain or shortness of breath or clear risk factors and is denying any chest pain and is not medically respecters for ACS-lower suspicion for ACS or dissection at this time. ? ?Discussed patient's presentation and ECG on arrival with on-call cardiologist Dr.  ? ?Agbor-Etang  ?Date of cardioverted discharge patient on Eliquis and metoprolol and outpatient follow-up outpatient. ? ?Patient actively cardioverted after being sedated with etomidate per procedure note above. ? ?Post cardioversion ECG is remarkable for sinus bradycardia with ventricular rate of 55, normal axis, unremarkable intervals with nonspecific change in lead III without any other clear evidence of acute ischemia or significant arrhythmia. ? ?TSH WNL.  Magnesium is WNL.  BMP is unremarkable.  No electrolyte or metabolic derangements.  CBC without leukocytosis or acute anemia.  Troponin is nonelevated and overall have a lower suspicion for ACS or ischemia precipitating his A-fib today. ? ?On assessment patient is feeling better.  He is in sinus rhythm on  EKG on monitor.  Per cardiology recommendations we will start on metoprolol and Eliquis.  Patient advised to not take any metoprolol and this will be discontinued.  At this point Evalose patient from electric path

## 2021-08-05 NOTE — ED Notes (Signed)
Consent obtained and placed in chart for cardioversion. Zoll pads placed on patient for cardioversion. Suction and ambu set up at bedside. RT called. Code cart at bedside. MD notified that patient is ready for procedure.  ?

## 2021-08-05 NOTE — Sedation Documentation (Signed)
Pt shocked with 100 J for cardioversion. Pt with conversion to NSR on monitor. EKG obtained.  ?

## 2021-08-05 NOTE — ED Notes (Signed)
Pt does not have anyone to come pick him up. Per Marylene Land, charge RN, pt may call an [facility]. Pt called [facility] and will be picked up in 10-15 minutes.  ?

## 2021-08-05 NOTE — Telephone Encounter (Signed)
Spoke with the patient. ?Patient d/c from the ED today for AFIB w.RVR. ?Patients zio was removed in the ED ?Dccv performed in the ED. ?Patient given Rx for Eliquis 5 mg bid and Metoprolol 50 mg qd at discharge. ? ?Appt scheduled with Dr. Garen Lah on 08/17/21 for f/u AFIB dccv. ?Adv the patient to take the prescribed medications as prescribed. ?He will contact the office in the interim if any reoccurrence. ? ?Adv the patient that I will fwd the update to Dr. Garen Lah. ?Patient verbalized understanding and voiced appreciation for the call back. ? ? ?

## 2021-08-05 NOTE — ED Notes (Addendum)
AVS with prescriptions provided to and discussed with patient. Pt verbalizes understanding of discharge instructions and denies any questions or concerns at this time. Pt has ride home. Pt ambulated out of department independently with steady gait. Pt seen getting into [employer] to go home.  ? ?

## 2021-08-05 NOTE — ED Triage Notes (Signed)
Pt via POV from home. Pt c/o lightheadedness and palpitations this AM. States that his apple watch states he is in a fib at 130s. "It feels like my heart is skipping a beat." Pt was seen by the cardiologist and is on a heart monitor. Pt is A&OX4 and NAD.  ?

## 2021-08-06 ENCOUNTER — Telehealth: Payer: Self-pay | Admitting: Cardiology

## 2021-08-06 NOTE — Telephone Encounter (Signed)
Patient calling the office for samples of medication:   1.  What medication and dosage are you requesting samples for? apixaban (ELIQUIS) 5 MG TABS tablet  2.  Are you currently out of this medication? Yes    

## 2021-08-06 NOTE — Telephone Encounter (Signed)
Spoke with patient and informed him that I left a $10 copay card for his Eliquis at our front desk. ?

## 2021-08-06 NOTE — Telephone Encounter (Signed)
Called patient and informed him that I left 2 weeks of samples for his Eliquis and a $10 [employer] card. Patient was very grateful for the call back and stated that he would be in tomorrow to pick it up. ?

## 2021-08-06 NOTE — Telephone Encounter (Signed)
Pt states that he needs forms filled out by Dr. Sandie Ano to get assistance for Eliquis. Pt has appt on 08/17/21 but needs then done before then. Pt would like a callback. Please advise ?

## 2021-08-10 ENCOUNTER — Encounter: Payer: Self-pay | Admitting: Cardiology

## 2021-08-14 ENCOUNTER — Ambulatory Visit (INDEPENDENT_AMBULATORY_CARE_PROVIDER_SITE_OTHER): Payer: Self-pay | Admitting: Cardiology

## 2021-08-14 ENCOUNTER — Ambulatory Visit (INDEPENDENT_AMBULATORY_CARE_PROVIDER_SITE_OTHER): Payer: Self-pay

## 2021-08-14 ENCOUNTER — Encounter: Payer: Self-pay | Admitting: Cardiology

## 2021-08-14 VITALS — BP 110/62 | HR 61 | Ht 69.0 in | Wt 195.0 lb

## 2021-08-14 DIAGNOSIS — I480 Paroxysmal atrial fibrillation: Secondary | ICD-10-CM

## 2021-08-14 DIAGNOSIS — K219 Gastro-esophageal reflux disease without esophagitis: Secondary | ICD-10-CM

## 2021-08-14 NOTE — Progress Notes (Signed)
?Cardiology Office Note:   ? ?Date:  08/14/2021  ? ?ID:  Joseph Joseph, DOB 11/14/1995, MRN [id number] ? ?PCP:  Felix Pacini, FNP ?  ?[facility] Providers ?Cardiologist:  Debbe Odea, MD    ? ?Referring MD: Felix Pacini, *  ? ?Chief Complaint  ?Patient presents with  ? Other  ?  Follow up post Cardioversion. Patient c.o chest pain. Meds reviewed verbally with patient.   ? ?Joseph Joseph is a 25 y.o. male who is being seen today for the evaluation of palpitations at the request of Felix Pacini, *. ? ? ?History of Present Illness:   ? ?Joseph Joseph is a 26 y.o. male with a hx of paroxysmal atrial fibrillation s/p DCCV 08/05/2021, anxiety, GERD, former smoker x7 years who presents for follow up.  ? ?Previously seen due to palpitations. Cardiac monitor was placed to evaluate any significant arrhythmias. Patient presented to the ED on 08/05/2021 with palpitations. Ekg showed afib with rvr HR 129. DCCV was performed in the ED with conversion to sinus. Eliquis and toprol xl was started.  He still has occasional skipped heartbeats/prominent heartbeats although much improved from prior.  Tolerating Eliquis with no bleeding issues. ? ? ?Past Medical History:  ?Diagnosis Date  ? Anxiety   ? GERD (gastroesophageal reflux disease)   ? ? ?History reviewed. No pertinent surgical history. ? ?Current Medications: ?Current Meds  ?Medication Sig  ? apixaban (ELIQUIS) 5 MG TABS tablet Take 1 tablet (5 mg total) by mouth 2 (two) times daily.  ? busPIRone (BUSPAR) 7.5 MG tablet Take 7.5 mg by mouth 3 (three) times daily.  ? metoprolol succinate (TOPROL XL) 50 MG 24 hr tablet Take 1 tablet (50 mg total) by mouth daily. Take with or immediately following a meal.  ? pantoprazole (PROTONIX) 40 MG tablet Take 1 tablet (40 mg total) by mouth daily.  ?  ? ?Allergies:   Rocephin [ceftriaxone]  ? ?Social History  ? ?Socioeconomic History  ? Marital status: Single  ?  Spouse name: Not on file  ? Number of  children: Not on file  ? Years of education: Not on file  ? Highest education level: Not on file  ?Occupational History  ? Not on file  ?Tobacco Use  ? Smoking status: Former  ? Smokeless tobacco: Never  ?Vaping Use  ? Vaping Use: Never used  ?Substance and Sexual Activity  ? Alcohol use: Not Currently  ?  Comment: 4-5 drinks per week  ? Drug use: Never  ? Sexual activity: Not on file  ?Other Topics Concern  ? Not on file  ?Social History Narrative  ? Not on file  ? ?Social Determinants of Health  ? ?Financial Resource Strain: Not on file  ?Food Insecurity: Not on file  ?Transportation Needs: Not on file  ?Physical Activity: Not on file  ?Stress: Not on file  ?Social Connections: Not on file  ?  ? ?Family History: ?The patient's family history is not on file. ? ?ROS:   ?Please see the history of present illness.    ? All other systems reviewed and are negative. ? ?EKGs/Labs/Other Studies Reviewed:   ? ?The following studies were reviewed today: ? ? ?EKG:  EKG is  ordered today.  The ekg ordered today demonstrates sinus rhythm, heart rate 61 ? ?Recent Labs: ?10/05/2020: ALT 15 ?08/05/2021: BUN 14; Creatinine, Ser 0.93; Hemoglobin 15.5; Magnesium 2.2; Platelets 249; Potassium 5.0; Sodium 137; TSH 1.629  ?Recent Lipid Panel ?   ?Component Value  Date/Time  ? CHOL 158 11/01/2018 1159  ? TRIG 52.0 11/01/2018 1159  ? HDL 50.70 11/01/2018 1159  ? CHOLHDL 3 11/01/2018 1159  ? VLDL 10.4 11/01/2018 1159  ? LDLCALC 97 11/01/2018 1159  ? ? ? ?Risk Assessment/Calculations:   ? ?    ? ?Physical Exam:   ? ?VS:  BP 110/62 (BP Location: Left Arm, Patient Position: Sitting, Cuff Size: Normal)   Pulse 61   Ht 5\' 9"  (1.753 m)   Wt 195 lb (88.5 kg)   SpO2 98%   BMI 28.80 kg/m?    ? ?Wt Readings from Last 3 Encounters:  ?08/14/21 195 lb (88.5 kg)  ?08/05/21 195 lb (88.5 kg)  ?07/31/21 197 lb (89.4 kg)  ?  ? ?GEN:  Well nourished, well developed in no acute distress ?HEENT: Normal ?NECK: No JVD; No carotid bruits ?LYMPHATICS: No  lymphadenopathy ?CARDIAC: RRR, no murmurs, rubs, gallops ?RESPIRATORY:  Clear to auscultation without rales, wheezing or rhonchi  ?ABDOMEN: Soft, non-tender, non-distended ?MUSCULOSKELETAL:  No edema; No deformity  ?SKIN: Warm and dry ?NEUROLOGIC:  Alert and oriented x 3 ?PSYCHIATRIC:  Normal affect  ? ?ASSESSMENT:   ? ?1. Paroxysmal atrial fibrillation (HCC)   ?2. Gastroesophageal reflux disease without esophagitis   ? ? ?PLAN:   ? ?In order of problems listed above: ? ?Symptomatic A-fib s/p DC cardioversion 07/2021.  Continue Toprol-XL, Eliquis.  Obtain echocardiogram.  Refer patient to A-fib clinic/EP.  Place cardiac monitor to document presence of A-fib and burden. ?History of GERD, continue Protonix. ? ?Follow-up in 3 months. ? ?   ? ? ?Medication Adjustments/Labs and Tests Ordered: ?Current medicines are reviewed at length with the patient today.  Concerns regarding medicines are outlined above.  ?Orders Placed This Encounter  ?Procedures  ? Ambulatory referral to Cardiac Electrophysiology  ? LONG TERM MONITOR (3-14 DAYS)  ? EKG 12-Lead  ? ECHOCARDIOGRAM COMPLETE  ? ?No orders of the defined types were placed in this encounter. ? ? ?Patient Instructions  ?Medication Instructions:  ? ?Your physician recommends that you continue on your current medications as directed. Please refer to the Current Medication list given to you today. ? ?*If you need a refill on your cardiac medications before your next appointment, please call your pharmacy* ? ? ?Lab Work: ? ?None ordered ? ?If you have labs (blood work) drawn today and your tests are completely normal, you will receive your results only by: ?MyChart Message (if you have MyChart) OR ?A paper copy in the mail ?If you have any lab test that is abnormal or we need to change your treatment, we will call you to review the results. ? ? ?Testing/Procedures: ? ?Your physician has requested that you have an echocardiogram in 3 weeks. Echocardiography is a painless test that  uses sound waves to create images of your heart. It provides your doctor with information about the size and shape of your heart and how well your heart?s chambers and valves are working. This procedure takes approximately one hour. There are no restrictions for this procedure. ? ? ?2.   Your physician has recommended that you wear a Zio XT monitor for 2 weeks. This will be mailed to your home address in 4-5 business days. ? ?This monitor is a medical device that records the heart?s electrical activity. Doctors most often use these monitors to diagnose arrhythmias. Arrhythmias are problems with the speed or rhythm of the heartbeat. The monitor is a small device applied to your chest. You can wear  one while you do your normal daily activities. While wearing this monitor if you have any symptoms to push the button and record what you felt. Once you have worn this monitor for the period of time provider prescribed (Usually 14 days), you will return the monitor device in the postage paid box. Once it is returned they will download the data collected and provide [place] with a report which the provider will then review and we will call you with those results. Important tips: ? ?Avoid showering during the first 24 hours of wearing the monitor. ?Avoid excessive sweating to help maximize wear time. ?Do not submerge the device, no hot tubs, and no swimming pools. ?Keep any lotions or oils away from the patch. ?After 24 hours you may shower with the patch on. Take brief showers with your back facing the shower head.  ?Do not remove patch once it has been placed because that will interrupt data and decrease adhesive wear time. ?Push the button when you have any symptoms and write down what you were feeling. ?Once you have completed wearing your monitor, remove and place into box which has postage paid and place in your outgoing mailbox.  ?If for some reason you have misplaced your box then call our office and we can provide another  box and/or mail it off for you. ? ? ? ? ? ?Follow-Up: ?At [facility], you and your health needs are our priority.  As part of our continuing mission to provide you with exceptional heart care, we have created

## 2021-08-14 NOTE — Patient Instructions (Addendum)
Medication Instructions:  ? ?Your physician recommends that you continue on your current medications as directed. Please refer to the Current Medication list given to you today. ? ?*If you need a refill on your cardiac medications before your next appointment, please call your pharmacy* ? ? ?Lab Work: ? ?None ordered ? ?If you have labs (blood work) drawn today and your tests are completely normal, you will receive your results only by: ?MyChart Message (if you have MyChart) OR ?A paper copy in the mail ?If you have any lab test that is abnormal or we need to change your treatment, we will call you to review the results. ? ? ?Testing/Procedures: ? ?Your physician has requested that you have an echocardiogram in 3 weeks. Echocardiography is a painless test that uses sound waves to create images of your heart. It provides your doctor with information about the size and shape of your heart and how well your heart?s chambers and valves are working. This procedure takes approximately one hour. There are no restrictions for this procedure. ? ? ?2.   Your physician has recommended that you wear a Zio XT monitor for 2 weeks. This will be mailed to your home address in 4-5 business days. ? ?This monitor is a medical device that records the heart?s electrical activity. Doctors most often use these monitors to diagnose arrhythmias. Arrhythmias are problems with the speed or rhythm of the heartbeat. The monitor is a small device applied to your chest. You can wear one while you do your normal daily activities. While wearing this monitor if you have any symptoms to push the button and record what you felt. Once you have worn this monitor for the period of time provider prescribed (Usually 14 days), you will return the monitor device in the postage paid box. Once it is returned they will download the data collected and provide [place] with a report which the provider will then review and we will call you with those results. Important  tips: ? ?Avoid showering during the first 24 hours of wearing the monitor. ?Avoid excessive sweating to help maximize wear time. ?Do not submerge the device, no hot tubs, and no swimming pools. ?Keep any lotions or oils away from the patch. ?After 24 hours you may shower with the patch on. Take brief showers with your back facing the shower head.  ?Do not remove patch once it has been placed because that will interrupt data and decrease adhesive wear time. ?Push the button when you have any symptoms and write down what you were feeling. ?Once you have completed wearing your monitor, remove and place into box which has postage paid and place in your outgoing mailbox.  ?If for some reason you have misplaced your box then call our office and we can provide another box and/or mail it off for you. ? ? ? ? ? ?Follow-Up: ?At [facility], you and your health needs are our priority.  As part of our continuing mission to provide you with exceptional heart care, we have created designated Provider Care Teams.  These Care Teams include your primary Cardiologist (physician) and Advanced Practice Providers (APPs -  Physician Assistants and Nurse Practitioners) who all work together to provide you with the care you need, when you need it. ? ?We recommend signing up for the patient portal called "MyChart".  Sign up information is provided on this After Visit Summary.  MyChart is used to connect with patients for Virtual Visits (Telemedicine).  Patients are able to  view lab/test results, encounter notes, upcoming appointments, etc.  Non-urgent messages can be sent to your provider as well.   ?To learn more about what you can do with MyChart, go to [email or web address].   ? ?Your next appointment:   ?3 month(s) ? ?The format for your next appointment:   ?In Person ? ?Provider:   ?You may see Debbe Odea, MD or one of the following Advanced Practice Providers on your designated Care Team:   ?Nicolasa Ducking, NP ?Eula Listen, PA-C ?Cadence Fransico Michael, PA-C  ? ? ?Other Instructions ? ? ?Important Information About Sugar ? ? ? ? ? ? ?

## 2021-08-17 ENCOUNTER — Ambulatory Visit: Payer: Self-pay | Admitting: Cardiology

## 2021-08-17 DIAGNOSIS — I480 Paroxysmal atrial fibrillation: Secondary | ICD-10-CM

## 2021-09-04 ENCOUNTER — Ambulatory Visit (INDEPENDENT_AMBULATORY_CARE_PROVIDER_SITE_OTHER): Payer: Self-pay

## 2021-09-04 ENCOUNTER — Other Ambulatory Visit: Payer: Self-pay

## 2021-09-04 DIAGNOSIS — I480 Paroxysmal atrial fibrillation: Secondary | ICD-10-CM

## 2021-09-04 LAB — ECHOCARDIOGRAM COMPLETE
AR max vel: 2.89 cm2
AV Area VTI: 2.97 cm2
AV Area mean vel: 2.94 cm2
AV Mean grad: 4 mmHg
AV Peak grad: 7.3 mmHg
Ao pk vel: 1.35 m/s
Area-P 1/2: 3.65 cm2
Calc EF: 57.8 %
S' Lateral: 2.8 cm
Single Plane A2C EF: 56.2 %
Single Plane A4C EF: 58.9 %

## 2021-09-04 MED ORDER — METOPROLOL SUCCINATE ER 50 MG PO TB24: 50.0000 mg | ORAL_TABLET | Freq: Every day | ORAL | 2 refills | Status: DC

## 2021-09-04 NOTE — Telephone Encounter (Signed)
*  STAT* If patient is at the pharmacy, call can be transferred to refill team.   1. Which medications need to be refilled? (please list name of each medication and dose if known) Metoprolol  2. Which pharmacy/location (including street and city if local pharmacy) is medication to be sent to?[facility]  3. Do they need a 30 day or 90 day supply? 30

## 2021-09-04 NOTE — Telephone Encounter (Signed)
Patient is here for echocardiogram and is requesting Eliquis samples.

## 2021-09-04 NOTE — Telephone Encounter (Signed)
Medication Samples have been provided to the patient. ? ?Drug name: Eliquis    Strength: 5 mg         Qty: 2 boxes   LOT: [id number] Exp.Date: 3/25 ?LOT: [id number]  Exp. 3/25 Qty: 1 Box ? ? ?Bishop Dublin ?8:10 AM ?09/04/2021  ?

## 2021-09-11 ENCOUNTER — Ambulatory Visit: Payer: Self-pay | Admitting: Cardiology

## 2021-09-14 ENCOUNTER — Encounter: Payer: Self-pay | Admitting: Cardiology

## 2021-09-15 ENCOUNTER — Telehealth: Payer: Self-pay

## 2021-09-15 NOTE — Telephone Encounter (Signed)
Reached out to patient in response to his MyChart message.  He stated that he was feeling a little better today. That he felt dizzy and like he had a fever. Patient did not know his BP or heart rate at this time as he was at work. I advised the patient that we should probably get an EKG to make sure he is not back in Afib, I was able to offer patient an appointment this Friday 5/26 with Nicolasa Ducking. Patient was grateful for the follow up.

## 2021-09-17 NOTE — Progress Notes (Unsigned)
Office Visit    Patient Name: Joseph Joseph Date of Encounter: 09/18/2021  Primary Care Provider:  Felix Pacini, FNP Primary Cardiologist:  Debbe Odea, MD Primary Electrophysiologist: None  Chief Complaint    Joseph Joseph is a 26 y.o. male with PMH of paroxysmal AF s/p DCCV 07/2021, anxiety, GERD who presents today for recent complaint of dizziness.  Past Medical History    Past Medical History:  Diagnosis Date   Anxiety    GERD (gastroesophageal reflux disease)    PAF (paroxysmal atrial fibrillation) (HCC)    a. 07/2021 s/p DCCV; b. 08/2021 Zio: no recurrent Afib; c. CHA2DS2VASc = 0.   No past surgical history on file.  Allergies  Allergies  Allergen Reactions   Rocephin [Ceftriaxone] Hives    History of Present Illness    Joseph Joseph was initially seen by Dr. Azucena Cecil on 07/31/2021 for referral of palpitations.  He had additional complaints of irregular skipped beats that have been ongoing for past 2 months.  He stated during visit that skipped beats would last about 30 minutes.  He is noted to have GERD and takes Protonix for this.  He was provided a 2-week ZIO monitor to evaluate palpitations, however he returned to the ED on 412 with complaints of lightheadedness and A-fib that was documented on his Apple Watch in the 130s.  Per EKG patient was found to be in AF with RVR and rate of 129.  He underwent DCCV with conversion to NSR following synchronized biphasic shock.  He was started on Eliquis and Toprol-XL.  He presented to follow-up appointment on 08/14/2021 and was noted to still have skipped heartbeats but he stated they were much improved.  He denied any bleeding issues with Eliquis.  ZIO monitor was reattempted for a 14-day trial to evaluate AF burden.  ZIO monitor revealed no evidence of AF and patient was instructed to continue medication therapy.  Patient contacted the office on 5/22 with complaint of headache at the base of his skull with  dizziness and fever and chills.  He was concerned that this may be a side effect of Eliquis.  Following review of pharmacists he was advised that this was not a Eliquis side effect and was encouraged to come in for follow-up to evaluate symptoms.  Since last being seen in the office patient reports that his symptoms have not resolved since taking the Eliquis and feels that they are related.  He is still having complaints of dizziness and fever-like symptoms.  Orthostatics completed today in office were reassuring and do not warrant further testing at this time. He denies any changes to his diet or medications such as supplements. He is compliant with his metoprolol and states that his palpitations have diminished in frequency since beginning therapy.  EKG today was sinus bradycardia with rate of 59. He denies any hematuria or melena with Eliquis.  He has completed 5 weeks of noninterrupted dosing with his Eliquis. Patient denies chest pain, palpitations, dyspnea, PND, orthopnea, nausea, vomiting, dizziness, syncope, edema, weight gain, or early satiety.  Home Medications    Current Outpatient Medications  Medication Sig Dispense Refill   busPIRone (BUSPAR) 7.5 MG tablet Take 7.5 mg by mouth 3 (three) times daily.     metoprolol succinate (TOPROL XL) 50 MG 24 hr tablet Take 1 tablet (50 mg total) by mouth daily. Take with or immediately following a meal. 30 tablet 2   pantoprazole (PROTONIX) 40 MG tablet Take 1 tablet (40 mg total)  by mouth daily. 30 tablet 2   No current facility-administered medications for this visit.     Review of Systems  Please see the history of present illness.    (+) Dizziness  (+) Fatigue  All other systems reviewed and are otherwise negative except as noted above.  Physical Exam    Wt Readings from Last 3 Encounters:  09/18/21 192 lb (87.1 kg)  08/14/21 195 lb (88.5 kg)  08/05/21 195 lb (88.5 kg)   VS: Vitals:   09/18/21 1009  BP: 116/69  Pulse: (!) 59   SpO2: 98%  ,Body mass index is 28.35 kg/m.  Constitutional:      Appearance: Healthy appearance. Not in distress.  Neck:     Vascular: JVD normal.  Pulmonary:     Effort: Pulmonary effort is normal.     Breath sounds: No wheezing. No rales. Diminished in the bases Cardiovascular:     Normal rate. Regular rhythm. Normal S1. Normal S2.      Murmurs: There is no murmur.  Edema:    Peripheral edema absent.  Abdominal:     Palpations: Abdomen is soft non tender. There is no hepatomegaly.  Skin:    General: Skin is warm and dry.  Neurological:     General: No focal deficit present.     Mental Status: Alert and oriented to person, place and time.     Cranial Nerves: Cranial nerves are intact.  EKG/LABS/Other Studies Reviewed    ECG personally reviewed by me today -sinus bradycardia with heart rate of 59 and no acute changes  Risk Assessment/Calculations:    CHA2DS2-VASc Score = 0  {Click here to calculate score.  REFRESH note before signing. :1} This indicates a 0.2% annual risk of stroke. The patient's score is based upon: CHF History: 0 HTN History: 0 Diabetes History: 0 Stroke History: 0 Vascular Disease History: 0 Age Score: 0 Gender Score: 0   Lab Results  Component Value Date   WBC 8.0 08/05/2021   HGB 15.5 08/05/2021   HCT 47.4 08/05/2021   MCV 93.1 08/05/2021   PLT 249 08/05/2021   Lab Results  Component Value Date   CREATININE 0.93 08/05/2021   BUN 14 08/05/2021   NA 137 08/05/2021   K 5.0 08/05/2021   CL 105 08/05/2021   CO2 25 08/05/2021   Lab Results  Component Value Date   ALT 15 10/05/2020   AST 19 10/05/2020   ALKPHOS 55 10/05/2020   BILITOT 1.4 (H) 10/05/2020   Lab Results  Component Value Date   CHOL 158 11/01/2018   HDL 50.70 11/01/2018   LDLCALC 97 11/01/2018   TRIG 52.0 11/01/2018   CHOLHDL 3 11/01/2018    No results found for: HGBA1C  Assessment & Plan    1.  Atrial fibrillation: -Heart rate today was sinus bradycardia with  a rate of 59.  Patient denies any reoccurring sustained palpitations or arrhythmias.  He is compliant with his metoprolol and denies any increased fatigue since starting medication. -He has been complaining of dizziness and flulike symptoms following his Eliquis dose. - Patient is in favor of discontinue Eliquis 5 mg today. He has completed a total of 5 weeks without interruption. -Patient has referral and appointment scheduled with Dr. Lalla BrothersLambert in June -Patient instructed to monitor heart rate daily and report any events of tachycardia -BMET and CBC today to rule out any occult bleeding or dehydration  2.  GERD: -Denies increased reflux symptoms with Eliquis -Continue Protonix  as prescribed  3.Palpitations: -Patient states that palpitations have decreased markedly since beginning metoprolol. -Continue Toprol-XL 50 mg twice daily  Disposition: Follow-up with EP referral as scheduled    Medication Adjustments/Labs and Tests Ordered: Current medicines are reviewed at length with the patient today.  Concerns regarding medicines are outlined above.   Signed, Napoleon Form, Leodis Rains, NP 09/18/2021, 12:13 PM [facility]

## 2021-09-18 ENCOUNTER — Encounter: Payer: Self-pay | Admitting: Nurse Practitioner

## 2021-09-18 ENCOUNTER — Other Ambulatory Visit
Admission: RE | Admit: 2021-09-18 | Discharge: 2021-09-18 | Disposition: A | Discharge status: Home / Self Care | Payer: PRIVATE HEALTH INSURANCE | Source: Ambulatory Visit | Attending: Nurse Practitioner | Admitting: Nurse Practitioner

## 2021-09-18 ENCOUNTER — Telehealth: Payer: Self-pay | Admitting: Nurse Practitioner

## 2021-09-18 ENCOUNTER — Ambulatory Visit (INDEPENDENT_AMBULATORY_CARE_PROVIDER_SITE_OTHER): Payer: PRIVATE HEALTH INSURANCE | Admitting: Nurse Practitioner

## 2021-09-18 VITALS — BP 116/69 | HR 59 | Ht 69.0 in | Wt 192.0 lb

## 2021-09-18 DIAGNOSIS — I480 Paroxysmal atrial fibrillation: Secondary | ICD-10-CM

## 2021-09-18 DIAGNOSIS — R002 Palpitations: Secondary | ICD-10-CM | POA: Diagnosis present

## 2021-09-18 DIAGNOSIS — K219 Gastro-esophageal reflux disease without esophagitis: Secondary | ICD-10-CM | POA: Diagnosis not present

## 2021-09-18 DIAGNOSIS — I499 Cardiac arrhythmia, unspecified: Secondary | ICD-10-CM | POA: Diagnosis present

## 2021-09-18 LAB — BASIC METABOLIC PANEL
Anion gap: 6 (ref 5–15)
BUN: 19 mg/dL (ref 6–20)
CO2: 26 mmol/L (ref 22–32)
Calcium: 9.2 mg/dL (ref 8.9–10.3)
Chloride: 105 mmol/L (ref 98–111)
Creatinine, Ser: 0.73 mg/dL (ref 0.61–1.24)
GFR, Estimated: >60 mL/min (ref >60)
Glucose, Bld: 94 mg/dL (ref 70–99)
Potassium: 4.2 mmol/L (ref 3.5–5.1)
Sodium: 137 mmol/L (ref 135–145)

## 2021-09-18 LAB — CBC
HCT: 45 % (ref 39.0–52.0)
Hemoglobin: 14.7 g/dL (ref 13.0–17.0)
MCH: 30.1 pg (ref 26.0–34.0)
MCHC: 32.7 g/dL (ref 30.0–36.0)
MCV: 92.2 fL (ref 80.0–100.0)
Platelets: 268 10*3/uL (ref 150–400)
RBC: 4.88 MIL/uL (ref 4.22–5.81)
RDW: 12.9 % (ref 11.5–15.5)
WBC: 8.4 10*3/uL (ref 4.0–10.5)
nRBC: 0 % (ref 0.0–0.2)

## 2021-09-18 NOTE — Patient Instructions (Signed)
Monitor heart rates  Medication Instructions:  Your physician has recommended you make the following change in your medication:   STOP Eliquis  *If you need a refill on your cardiac medications before your next appointment, please call your pharmacy*   Lab Work: None  If you have labs (blood work) drawn today and your tests are completely normal, you will receive your results only by: MyChart Message (if you have MyChart) OR A paper copy in the mail If you have any lab test that is abnormal or we need to change your treatment, we will call you to review the results.   Testing/Procedures: None   Follow-Up: At [facility], you and your health needs are our priority.  As part of our continuing mission to provide you with exceptional heart care, we have created designated Provider Care Teams.  These Care Teams include your primary Cardiologist (physician) and Advanced Practice Providers (APPs -  Physician Assistants and Nurse Practitioners) who all work together to provide you with the care you need, when you need it.   Your next appointment:   Keep scheduled appointments October 07, 2021 at 08:00 am with Dr. Lalla Brothers November 13, 2021 at 08:00 am with Dr. Azucena Cecil  The format for your next appointment:   In Person  Provider:   Debbe Odea, MD or Nicolasa Ducking, NP        Important Information About Sugar

## 2021-09-18 NOTE — Telephone Encounter (Signed)
Reviewed with patient that they would like him to have some labs done at his convenience over at the [facility] entrance. He was agreeable and will have those done. No further needs at this time.

## 2021-10-07 ENCOUNTER — Ambulatory Visit (INDEPENDENT_AMBULATORY_CARE_PROVIDER_SITE_OTHER): Payer: PRIVATE HEALTH INSURANCE | Admitting: Cardiology

## 2021-10-07 ENCOUNTER — Encounter: Payer: Self-pay | Admitting: Cardiology

## 2021-10-07 VITALS — BP 120/72 | HR 75 | Ht 69.0 in | Wt 192.4 lb

## 2021-10-07 DIAGNOSIS — I480 Paroxysmal atrial fibrillation: Secondary | ICD-10-CM | POA: Diagnosis not present

## 2021-10-07 DIAGNOSIS — R002 Palpitations: Secondary | ICD-10-CM

## 2021-10-07 NOTE — Patient Instructions (Signed)
Medications: Your physician recommends that you continue on your current medications as directed. Please refer to the Current Medication list given to you today. *If you need a refill on your cardiac medications before your next appointment, please call your pharmacy*  Lab Work: None. If you have labs (blood work) drawn today and your tests are completely normal, you will receive your results only by: MyChart Message (if you have MyChart) OR A paper copy in the mail If you have any lab test that is abnormal or we need to change your treatment, we will call you to review the results.  Testing/Procedures: Your physician has recommended that you have a sleep study. This test records several body functions during sleep, including: brain activity, eye movement, oxygen and carbon dioxide blood levels, heart rate and rhythm, breathing rate and rhythm, the flow of air through your mouth and nose, snoring, body muscle movements, and chest and belly movement.   Follow-Up: At [facility], you and your health needs are our priority.  As part of our continuing mission to provide you with exceptional heart care, we have created designated Provider Care Teams.  These Care Teams include your primary Cardiologist (physician) and Advanced Practice Providers (APPs -  Physician Assistants and Nurse Practitioners) who all work together to provide you with the care you need, when you need it.  Your physician wants you to follow-up in: As needed with Dr. Steffanie Dunn.  We recommend signing up for the patient portal called "MyChart".  Sign up information is provided on this After Visit Summary.  MyChart is used to connect with patients for Virtual Visits (Telemedicine).  Patients are able to view lab/test results, encounter notes, upcoming appointments, etc.  Non-urgent messages can be sent to your provider as well.   To learn more about what you can do with MyChart, go to [email or web address].    Any Other  Special Instructions Will Be Listed Below (If Applicable).

## 2021-10-07 NOTE — Progress Notes (Addendum)
Electrophysiology Office Note:    Date:  10/07/2021   ID:  Joseph Joseph, DOB 11/13/1995, MRN [id number]  PCP:  Felix Pacini, FNP  [facility] Cardiologist:  Debbe Odea, MD  [facility] Electrophysiologist:  None   Referring MD: Debbe Odea, MD   Chief Complaint: Atrial fibrillation  History of Present Illness:    Joseph Joseph is a 26 y.o. male who presents for an evaluation of atrial fibrillation at the request of Dr. Azucena Cecil. Their medical history includes GERD.  He was seen by Robin Searing in clinic on Sep 18, 2021.  The patient has previously undergone a cardioversion for his atrial fibrillation in April 2023.  During previous appointments his atrial fibrillation episodes reportedly lasted 30 minutes.  He has presented to the ER in the past for highly symptomatic salvos of atrial fibrillation.  Ventricular rates as high as 130s.  He was started on Eliquis and Toprol after his cardioversion in April.  He did not tolerate Eliquis given linked to headaches.  After 5 weeks of Eliquis, the medication was stopped because of a low CHA2DS2-VASc.  The patient tells me he has a history of heavy alcohol abuse but is sober now.  He had not been ill at the time of his atrial fibrillation episode.  No alcohol use at the time of the atrial fibrillation episode.  His grandfather has a history of atrial fibrillation.  His mother has a history of ventricular fibrillation and has a defibrillator in place.  The mother had her ICD implanted after a VF event in the setting of heavy Imodium use and electrolyte disturbances.  The patient has a deviated septum and is unsure whether or not he snores at night.  He does have fatigue and dizziness at night which is suspicious for sleep disordered breathing.  He works in Consulting civil engineer.     Past Medical History:  Diagnosis Date   Anxiety    GERD (gastroesophageal reflux disease)    PAF (paroxysmal atrial fibrillation) (HCC)    a. 07/2021  s/p DCCV; b. 08/2021 Zio: no recurrent Afib; c. CHA2DS2VASc = 0.    No past surgical history on file.  Current Medications: Current Meds  Medication Sig   busPIRone (BUSPAR) 7.5 MG tablet Take 7.5 mg by mouth 3 (three) times daily.   metoprolol succinate (TOPROL XL) 50 MG 24 hr tablet Take 1 tablet (50 mg total) by mouth daily. Take with or immediately following a meal.   pantoprazole (PROTONIX) 40 MG tablet Take 1 tablet (40 mg total) by mouth daily.     Allergies:   Rocephin [ceftriaxone]   Social History   Socioeconomic History   Marital status: Single    Spouse name: Not on file   Number of children: Not on file   Years of education: Not on file   Highest education level: Not on file  Occupational History   Not on file  Tobacco Use   Smoking status: Former   Smokeless tobacco: Never  Vaping Use   Vaping Use: Never used  Substance and Sexual Activity   Alcohol use: Not Currently    Comment: 4-5 drinks per week   Drug use: Never   Sexual activity: Not on file  Other Topics Concern   Not on file  Social History Narrative   Not on file   Social Determinants of Health   Financial Resource Strain: Not on file  Food Insecurity: Not on file  Transportation Needs: Not on file  Physical Activity: Not  on file  Stress: Not on file  Social Connections: Not on file     Family History: The patient's family history is not on file.  ROS:   Please see the history of present illness.    All other systems reviewed and are negative.  EKGs/Labs/Other Studies Reviewed:    The following studies were reviewed today:  August 05, 2021 EKG shows atrial fibrillation with a rapid ventricular rate around 130 bpm  Sep 04, 2021 echo EF 55% RV normal Trivial MR     Recent Labs: 08/05/2021: Magnesium 2.2; TSH 1.629 09/18/2021: BUN 19; Creatinine, Ser 0.73; Hemoglobin 14.7; Platelets 268; Potassium 4.2; Sodium 137  Recent Lipid Panel    Component Value Date/Time   CHOL 158  11/01/2018 1159   TRIG 52.0 11/01/2018 1159   HDL 50.70 11/01/2018 1159   CHOLHDL 3 11/01/2018 1159   VLDL 10.4 11/01/2018 1159   LDLCALC 97 11/01/2018 1159    Physical Exam:    VS:  BP 120/72   Pulse 75   Ht 5\' 9"  (1.753 m)   Wt 192 lb 6.4 oz (87.3 kg)   SpO2 97%   BMI 28.41 kg/m     Wt Readings from Last 3 Encounters:  10/07/21 192 lb 6.4 oz (87.3 kg)  09/18/21 192 lb (87.1 kg)  08/14/21 195 lb (88.5 kg)     GEN:  Well nourished, well developed in no acute distress HEENT: Normal NECK: No JVD; No carotid bruits LYMPHATICS: No lymphadenopathy CARDIAC: RRR, no murmurs, rubs, gallops RESPIRATORY:  Clear to auscultation without rales, wheezing or rhonchi  ABDOMEN: Soft, non-tender, non-distended MUSCULOSKELETAL:  No edema; No deformity  SKIN: Warm and dry NEUROLOGIC:  Alert and oriented x 3 PSYCHIATRIC:  Normal affect       ASSESSMENT:    1. PAF (paroxysmal atrial fibrillation) (HCC)   2. Palpitations    PLAN:    In order of problems listed above:  #Paroxysmal atrial fibrillation Single episode requiring cardioversion.  Unclear cause.  Will order a sleep study to assess for sleep apnea given fatigue/dizziness at night.  Continue metoprolol.  CHA2DS2-VASc of 0.  Recommend continuing to monitor heart rhythms using Apple watch and close primary care follow-up.  #History of alcohol abuse Congratulated on being sober.  Recommend abstaining from alcohol given history of atrial arrhythmias.  #Possible sleep disordered breathing Order sleep study today.   Medication Adjustments/Labs and Tests Ordered: Current medicines are reviewed at length with the patient today.  Concerns regarding medicines are outlined above.  No orders of the defined types were placed in this encounter.  No orders of the defined types were placed in this encounter.    Signed, 08/16/21. Rossie Muskrat, MD, [facility], [facility] 10/07/2021 9:55 AM    Electrophysiology [facility]

## 2021-10-08 NOTE — Addendum Note (Signed)
Addended by: Sampson Goon on: 10/08/2021 05:05 PM   Modules accepted: Orders

## 2021-10-14 ENCOUNTER — Telehealth: Payer: Self-pay | Admitting: *Deleted

## 2021-10-14 ENCOUNTER — Encounter: Payer: Self-pay | Admitting: Cardiology

## 2021-10-14 NOTE — Telephone Encounter (Signed)
Prior Authorization for [facility] sent to [employer] via web portal. Tracking Number .

## 2021-10-17 ENCOUNTER — Encounter: Payer: Self-pay | Admitting: Emergency Medicine

## 2021-10-17 ENCOUNTER — Ambulatory Visit
Admission: EM | Admit: 2021-10-17 | Discharge: 2021-10-17 | Disposition: A | Discharge status: Home / Self Care | Payer: PRIVATE HEALTH INSURANCE

## 2021-10-17 DIAGNOSIS — K2100 Gastro-esophageal reflux disease with esophagitis, without bleeding: Secondary | ICD-10-CM | POA: Diagnosis not present

## 2021-10-19 ENCOUNTER — Encounter (INDEPENDENT_AMBULATORY_CARE_PROVIDER_SITE_OTHER): Payer: PRIVATE HEALTH INSURANCE | Admitting: Cardiology

## 2021-10-19 DIAGNOSIS — I480 Paroxysmal atrial fibrillation: Secondary | ICD-10-CM

## 2021-10-19 NOTE — Telephone Encounter (Signed)
Gave patient pin code to start sleep study.  Verbalized understanding.

## 2021-10-20 ENCOUNTER — Ambulatory Visit: Payer: PRIVATE HEALTH INSURANCE

## 2021-10-20 DIAGNOSIS — I480 Paroxysmal atrial fibrillation: Secondary | ICD-10-CM

## 2021-10-20 DIAGNOSIS — R002 Palpitations: Secondary | ICD-10-CM

## 2021-10-20 NOTE — Procedures (Signed)
    SLEEP STUDY REPORT Patient Information Study Date: 10/19/21 Patient Name: Joseph Joseph Patient ID: [id number] Birth Date: Aug 19, 1995 Age: 26 Gender:  BMI: 28.4 (W=192 lb, H=5' 9'') Referring Physician: Steffanie Dunn, MD  TEST DESCRIPTION: Home sleep apnea testing was completed using the WatchPat, a Type 1 device, utilizing  peripheral arterial tonometry (PAT), chest movement, actigraphy, pulse oximetry, pulse rate, body position and snore.  AHI was calculated with apnea and hypopnea using valid sleep time as the denominator. RDI includes apneas,  hypopneas, and RERAs. The data acquired and the scoring of sleep and all associated events were performed in  accordance with the recommended standards and specifications as outlined in the AASM Manual for the Scoring of  Sleep and Associated Events 2.2.0 (2015).   FINDINGS: 1. No evidence of Obstructive Sleep Apnea with AHI 0.6/hr.  2. No Central Sleep Apnea. 3. Oxygen desaturations as low as 91%. 4. Minimal snoring was present. O2 sats were < 88% for 0 minutes. 5. Total sleep time was 6 hrs and 45 min. 6. 36.2% of total sleep time was spent in REM sleep.  7. Normal sleep onset latency at 16 min.  8. Shortened REM sleep onset latency at 64 min.  9. Total awakenings were 4.   DIAGNOSIS:  Normal study with no significant sleep disordered breathing.  RECOMMENDATIONS: 1. Normal study with no significant sleep disordered breathing.  2. Healthy sleep recommendations include: adequate nightly sleep (normal 7-9 hrs/night), avoidance of caffeine after  noon and alcohol near bedtime, and maintaining a sleep environment that is cool, dark and quiet.  3. Weight loss for overweight patients is recommended.   4. Snoring recommendations include: weight loss where appropriate, side sleeping, and avoidance of alcohol before  bed.  5. Operation of motor vehicle or dangerous equipment must be avoided when feeling drowsy, excessively sleepy,  or  mentally fatigued.   6. An ENT consultation which may be useful for specific causes of and possible treatment of bothersome snoring .   7. Weight loss may be of benefit in reducing the severity of snoring.   Signature: Electronically Signed: 10/20/21 Armanda Magic, MD; [facility]; Diplomat, American Board of  Sleep Medicine

## 2021-10-21 ENCOUNTER — Telehealth: Payer: PRIVATE HEALTH INSURANCE | Admitting: Physician Assistant

## 2021-10-21 ENCOUNTER — Telehealth: Payer: Self-pay | Admitting: *Deleted

## 2021-10-21 ENCOUNTER — Encounter: Payer: Self-pay | Admitting: Cardiology

## 2021-10-21 DIAGNOSIS — I480 Paroxysmal atrial fibrillation: Secondary | ICD-10-CM

## 2021-10-21 DIAGNOSIS — K2100 Gastro-esophageal reflux disease with esophagitis, without bleeding: Secondary | ICD-10-CM

## 2021-10-21 DIAGNOSIS — J342 Deviated nasal septum: Secondary | ICD-10-CM

## 2021-10-21 DIAGNOSIS — R002 Palpitations: Secondary | ICD-10-CM

## 2021-10-21 NOTE — Telephone Encounter (Signed)
The patient has been notified of the result and verbalized understanding.  All questions (if any) were answered. Latrelle Dodrill, CMA 10/21/2021 5:33 PM    Pt is aware and agreeable to normal results.

## 2021-10-21 NOTE — Telephone Encounter (Signed)
-----   Message from Gaynelle Cage, [place] sent at 10/21/2021  9:02 AM EDT -----  ----- Message ----- From: Quintella Reichert, MD Sent: 10/20/2021   1:53 PM EDT To: [facility] Sleep Studies  Normal home sleep study so in lab PSG will be ordered

## 2021-10-21 NOTE — Progress Notes (Signed)
Virtual Visit Consent   Joseph Joseph, you are scheduled for a virtual visit with a [facility] provider today. Just as with appointments in the office, your consent must be obtained to participate. Your consent will be active for this visit and any virtual visit you may have with one of our providers in the next 365 days. If you have a MyChart account, a copy of this consent can be sent to you electronically.  As this is a virtual visit, video technology does not allow for your provider to perform a traditional examination. This may limit your provider's ability to fully assess your condition. If your provider identifies any concerns that need to be evaluated in person or the need to arrange testing (such as labs, EKG, etc.), we will make arrangements to do so. Although advances in technology are sophisticated, we cannot ensure that it will always work on either your end or our end. If the connection with a video visit is poor, the visit may have to be switched to a telephone visit. With either a video or telephone visit, we are not always able to ensure that we have a secure connection.  By engaging in this virtual visit, you consent to the provision of healthcare and authorize for your insurance to be billed (if applicable) for the services provided during this visit. Depending on your insurance coverage, you may receive a charge related to this service.  I need to obtain your verbal consent now. Are you willing to proceed with your visit today? [name] has provided verbal consent on 10/21/2021 for a virtual visit (video or telephone). Margaretann Loveless, PA-C  Date: 10/21/2021 12:03 PM  Virtual Visit via Video Note   IMargaretann Loveless, connected with  Joseph Joseph  ([id number], November 19, 1995) on 10/21/21 at 11:30 AM EDT by a video-enabled telemedicine application and verified that I am speaking with the correct person using two identifiers.  Location: Patient: Virtual Visit  Location Patient: Home Provider: Virtual Visit Location Provider: Home Office   I discussed the limitations of evaluation and management by telemedicine and the availability of in person appointments. The patient expressed understanding and agreed to proceed.    History of Present Illness: Joseph Joseph is a 26 y.o. who identifies as a male who was assigned male at birth, and is being seen today for heart palpitations. Has Paroxysmal atrial fibrillation. Has Cardiologist. Most recently seen on 10/07/21. Was to continue metoprolol at the time. Last EKG on 09/18/21 showed sinus bradycardia. Could feel heart beats more pronounced. BP was 120/78. Reports heart rate was 100 on his watch. Previously with PAF diagnosis his watch notified of atrial fibrillation with HR 120s. He has had Holter monitor, cardioversion in April 2023, EKGs, sleep study, and labs. Most recent labs and EKG from 09/18/21 have all been normal. He has since been able to stop Eliquis. Continues Metoprolol. Was trying to taper Pantoprazole for reflux. Had acute episode of reflux and throat tightening sensation and was seen at [facility] on 10/17/21, protonix restarted. Now with new episode palpitation/mild tachycardia.    Problems:  Patient Active Problem List   Diagnosis Date Noted   BMI 32.0-32.9,adult 11/01/2018    Allergies:  Allergies  Allergen Reactions   Rocephin [Ceftriaxone] Hives   Medications:  Current Outpatient Medications:    busPIRone (BUSPAR) 7.5 MG tablet, Take 7.5 mg by mouth 3 (three) times daily., Disp: , Rfl:    metoprolol succinate (TOPROL XL) 50 MG 24 hr tablet, Take 1  tablet (50 mg total) by mouth daily. Take with or immediately following a meal., Disp: 30 tablet, Rfl: 2   pantoprazole (PROTONIX) 40 MG tablet, Take 1 tablet (40 mg total) by mouth daily., Disp: 30 tablet, Rfl: 2  Observations/Objective: Patient is well-developed, well-nourished in no acute distress.  Resting comfortably at home.  Head is  normocephalic, atraumatic.  No labored breathing.  Speech is clear and coherent with logical content.  Patient is alert and oriented at baseline.    Assessment and Plan: 1. Heart palpitations  2. Paroxysmal atrial fibrillation (HCC)  3. Gastroesophageal reflux disease with esophagitis, unspecified whether hemorrhage  - Advised to contact Cardiologist for most recent episode - Continue Protonix (pantoprazole) and Metorpolol - May benefit from referral to GI for GERD/esophagitis/hiatal hernia evaluation, diagnostic testing, and treatment considerations - Reviewed labs and sleep study with patient - Seek in person evaluation if worsening  Follow Up Instructions: I discussed the assessment and treatment plan with the patient. The patient was provided an opportunity to ask questions and all were answered. The patient agreed with the plan and demonstrated an understanding of the instructions.  A copy of instructions were sent to the patient via MyChart unless otherwise noted below.    The patient was advised to call back or seek an in-person evaluation if the symptoms worsen or if the condition fails to improve as anticipated.  Time:  I spent 15 minutes with the patient via telehealth technology discussing the above problems/concerns.    Margaretann Loveless, PA-C

## 2021-10-21 NOTE — Patient Instructions (Signed)
[name], thank you for joining Margaretann Loveless, PA-C for today's virtual visit.  While this provider is not your primary care provider (PCP), if your PCP is located in our provider database this encounter information will be shared with them immediately following your visit.  Consent: (Patient) [name] provided verbal consent for this virtual visit at the beginning of the encounter.  Current Medications:  Current Outpatient Medications:    busPIRone (BUSPAR) 7.5 MG tablet, Take 7.5 mg by mouth 3 (three) times daily., Disp: , Rfl:    metoprolol succinate (TOPROL XL) 50 MG 24 hr tablet, Take 1 tablet (50 mg total) by mouth daily. Take with or immediately following a meal., Disp: 30 tablet, Rfl: 2   pantoprazole (PROTONIX) 40 MG tablet, Take 1 tablet (40 mg total) by mouth daily., Disp: 30 tablet, Rfl: 2   Medications ordered in this encounter:  No orders of the defined types were placed in this encounter.    *If you need refills on other medications prior to your next appointment, please contact your pharmacy*  Follow-Up: Call back or seek an in-person evaluation if the symptoms worsen or if the condition fails to improve as anticipated.  Other Instructions Palpitations Palpitations are feelings that your heartbeat is irregular or is faster than normal. It may feel like your heart is fluttering or skipping a beat. Palpitations may be caused by many things, including smoking, caffeine, alcohol, stress, and certain medicines or drugs. Most causes of palpitations are not serious.  However, some palpitations can be a sign of a serious problem. Further tests and a thorough medical history will be done to find the cause of your palpitations. Your provider may order tests such as an ECG, labs, an echocardiogram, or an ambulatory continuous ECG monitor. Follow these instructions at home: Pay attention to any changes in your symptoms. Let your health care provider know about  them. Take these actions to help manage your symptoms: Eating and drinking Follow instructions from your health care provider about eating or drinking restrictions. You may need to avoid foods and drinks that may cause palpitations. These may include: Caffeinated coffee, tea, soft drinks, and energy drinks. Chocolate. Alcohol. Diet pills. Lifestyle     Take steps to reduce your stress and anxiety. Things that can help you relax include: Yoga. Mind-body activities, such as deep breathing, meditation, or using words and images to create positive thoughts (guided imagery). Physical activity, such as swimming, jogging, or walking. Tell your health care provider if your palpitations increase with activity. If you have chest pain or shortness of breath with activity, do not continue the activity until you are seen by your health care provider. Biofeedback. This is a method that helps you learn to use your mind to control things in your body, such as your heartbeat. Get plenty of rest and sleep. Keep a regular bed time. Do not use drugs, including cocaine or ecstasy. Do not use marijuana. Do not use any products that contain nicotine or tobacco. These products include cigarettes, chewing tobacco, and vaping devices, such as e-cigarettes. If you need help quitting, ask your health care provider. General instructions Take over-the-counter and prescription medicines only as told by your health care provider. Keep all follow-up visits. This is important. These may include visits for further testing if palpitations do not go away or get worse. Contact a health care provider if: You continue to have a fast or irregular heartbeat for a long period of time. You notice that  your palpitations occur more often. Get help right away if: You have chest pain or shortness of breath. You have a severe headache. You feel dizzy or you faint. These symptoms may represent a serious problem that is an emergency. Do  not wait to see if the symptoms will go away. Get medical help right away. Call your local emergency services (911 in the [place] Do not drive yourself to the hospital. Summary Palpitations are feelings that your heartbeat is irregular or is faster than normal. It may feel like your heart is fluttering or skipping a beat. Palpitations may be caused by many things, including smoking, caffeine, alcohol, stress, certain medicines, and drugs. Further tests and a thorough medical history may be done to find the cause of your palpitations. Get help right away if you faint or have chest pain, shortness of breath, severe headache, or dizziness. This information is not intended to replace advice given to you by your health care provider. Make sure you discuss any questions you have with your health care provider. Document Revised: 09/03/2020 Document Reviewed: 09/03/2020 Elsevier Patient Education  2023 Elsevier Inc.    If you have been instructed to have an in-person evaluation today at a local Urgent Care facility, please use the link below. It will take you to a list of all of our available [facility], including address, phone number and hours of operation. Please do not delay care.  [facility]  If you or a family member do not have a primary care provider, use the link below to schedule a visit and establish care. When you choose a [facility] primary care physician or advanced practice provider, you gain a long-term partner in health. Find a Primary Care Provider  Learn more about [facility]'s in-office and virtual care options: [facility] - Get Care Now

## 2021-11-04 NOTE — Telephone Encounter (Signed)
Referral has been placed. 

## 2021-11-13 ENCOUNTER — Ambulatory Visit (INDEPENDENT_AMBULATORY_CARE_PROVIDER_SITE_OTHER): Payer: PRIVATE HEALTH INSURANCE | Admitting: Cardiology

## 2021-11-13 ENCOUNTER — Encounter: Payer: Self-pay | Admitting: Cardiology

## 2021-11-13 VITALS — BP 100/60 | HR 59 | Ht 69.0 in | Wt 196.5 lb

## 2021-11-13 DIAGNOSIS — I480 Paroxysmal atrial fibrillation: Secondary | ICD-10-CM | POA: Diagnosis not present

## 2021-11-13 DIAGNOSIS — K219 Gastro-esophageal reflux disease without esophagitis: Secondary | ICD-10-CM | POA: Diagnosis not present

## 2021-11-13 MED ORDER — METOPROLOL SUCCINATE ER 50 MG PO TB24: 50.0000 mg | ORAL_TABLET | Freq: Every day | ORAL | 3 refills | Status: DC

## 2021-11-13 NOTE — Progress Notes (Signed)
Cardiology Office Note:    Date:  11/13/2021   ID:  Joseph Joseph, DOB [birth year], MRN [id number]  PCP:  Oneita Hurt, No   [facility] Providers Cardiologist:  Debbe Odea, MD Electrophysiologist:  Lanier Prude, MD     Referring MD: Felix Pacini, *   Chief Complaint  Patient presents with   3 month follow up    Patient c/o chest pressure at times. Medications reviewed by the patient verbally.     History of Present Illness:    Joseph Joseph is a 26 y.o. male with a hx of paroxysmal atrial fibrillation s/p DCCV 08/05/2021, anxiety, GERD, former smoker x7 years who presents for follow up.   Being seen due to A-fib, medication management.  Echocardiogram was ordered to evaluate any cardiac dysfunction, cardiac monitor in place to evaluate recurrence of atrial fibrillation since DC cardioversion was performed.  Takes medications as prescribed, denies any bleeding issues with taking Eliquis.   Past Medical History:  Diagnosis Date   Anxiety    GERD (gastroesophageal reflux disease)    PAF (paroxysmal atrial fibrillation) (HCC)    a. 07/2021 s/p DCCV; b. 08/2021 Zio: no recurrent Afib; c. CHA2DS2VASc = 0.    History reviewed. No pertinent surgical history.  Current Medications: Current Meds  Medication Sig   busPIRone (BUSPAR) 7.5 MG tablet Take 7.5 mg by mouth 3 (three) times daily.   pantoprazole (PROTONIX) 40 MG tablet Take 1 tablet (40 mg total) by mouth daily.   [DISCONTINUED] metoprolol succinate (TOPROL XL) 50 MG 24 hr tablet Take 1 tablet (50 mg total) by mouth daily. Take with or immediately following a meal.     Allergies:   Rocephin [ceftriaxone]   Social History   Socioeconomic History   Marital status: Single    Spouse name: Not on file   Number of children: Not on file   Years of education: Not on file   Highest education level: Not on file  Occupational History   Not on file  Tobacco Use   Smoking status: Former   Smokeless tobacco:  Never  Vaping Use   Vaping Use: Never used  Substance and Sexual Activity   Alcohol use: Not Currently    Comment: 4-5 drinks per week   Drug use: Never   Sexual activity: Not on file  Other Topics Concern   Not on file  Social History Narrative   Not on file   Social Determinants of Health   Financial Resource Strain: Not on file  Food Insecurity: Not on file  Transportation Needs: Not on file  Physical Activity: Not on file  Stress: Not on file  Social Connections: Not on file     Family History: The patient's family history is not on file.  ROS:   Please see the history of present illness.     All other systems reviewed and are negative.  EKGs/Labs/Other Studies Reviewed:    The following studies were reviewed today:   EKG:  EKG is  ordered today.  The ekg ordered today demonstrates sinus rhythm, heart rate 61  Recent Labs: 08/05/2021: Magnesium 2.2; TSH 1.629 09/18/2021: BUN 19; Creatinine, Ser 0.73; Hemoglobin 14.7; Platelets 268; Potassium 4.2; Sodium 137  Recent Lipid Panel    Component Value Date/Time   CHOL 158 11/01/2018 1159   TRIG 52.0 11/01/2018 1159   HDL 50.70 11/01/2018 1159   CHOLHDL 3 11/01/2018 1159   VLDL 10.4 11/01/2018 1159   LDLCALC 97 11/01/2018 1159  Risk Assessment/Calculations:    STOP-Bang Score:  3       Physical Exam:    VS:  BP 100/60 (BP Location: Left Arm, Patient Position: Sitting, Cuff Size: Normal)   Pulse (!) 59   Ht 5\' 9"  (1.753 m)   Wt 196 lb 8 oz (89.1 kg)   SpO2 98%   BMI 29.02 kg/m     Wt Readings from Last 3 Encounters:  11/13/21 196 lb 8 oz (89.1 kg)  10/07/21 192 lb 6.4 oz (87.3 kg)  09/18/21 192 lb (87.1 kg)     GEN:  Well nourished, well developed in no acute distress HEENT: Normal NECK: No JVD; No carotid bruits LYMPHATICS: No lymphadenopathy CARDIAC: RRR, no murmurs, rubs, gallops RESPIRATORY:  Clear to auscultation without rales, wheezing or rhonchi  ABDOMEN: Soft, non-tender,  non-distended MUSCULOSKELETAL:  No edema; No deformity  SKIN: Warm and dry NEUROLOGIC:  Alert and oriented x 3 PSYCHIATRIC:  Normal affect   ASSESSMENT:    1. Paroxysmal atrial fibrillation (HCC)   2. Gastroesophageal reflux disease without esophagitis    PLAN:    In order of problems listed above:  Symptomatic, Parox A-fib s/p DC cardioversion 07/2021.  Normal EF 55-60%, normal LA size.  Cardiac monitor x2 weeks with no evidence for atrial fibrillation.  Continue Toprol-XL, Eliquis. History of GERD, continue Protonix.  Follow-up in 6-12 months.     Medication Adjustments/Labs and Tests Ordered: Current medicines are reviewed at length with the patient today.  Concerns regarding medicines are outlined above.  Orders Placed This Encounter  Procedures   EKG 12-Lead   Meds ordered this encounter  Medications   metoprolol succinate (TOPROL XL) 50 MG 24 hr tablet    Sig: Take 1 tablet (50 mg total) by mouth daily. Take with or immediately following a meal.    Dispense:  90 tablet    Refill:  3    Patient Instructions  Medication Instructions:   Your physician recommends that you continue on your current medications as directed. Please refer to the Current Medication list given to you today.  *If you need a refill on your cardiac medications before your next appointment, please call your pharmacy*    Follow-Up: At [facility], you and your health needs are our priority.  As part of our continuing mission to provide you with exceptional heart care, we have created designated Provider Care Teams.  These Care Teams include your primary Cardiologist (physician) and Advanced Practice Providers (APPs -  Physician Assistants and Nurse Practitioners) who all work together to provide you with the care you need, when you need it.  We recommend signing up for the patient portal called "MyChart".  Sign up information is provided on this After Visit Summary.  MyChart is used to connect  with patients for Virtual Visits (Telemedicine).  Patients are able to view lab/test results, encounter notes, upcoming appointments, etc.  Non-urgent messages can be sent to your provider as well.   To learn more about what you can do with MyChart, go to [facility].    Your next appointment:   6-12 month(s)  The format for your next appointment:   In Person  Provider:   You may see [email or web address], MD or one of the following Advanced Practice Providers on your designated Care Team:   Debbe Odea, NP Nicolasa Ducking, PA-C Cadence Eula Listen, Fransico Michael    Other Instructions   Important Information About Sugar         Signed, [place]  Agbor-Etang, MD  11/13/2021 9:04 AM    [place]

## 2021-11-13 NOTE — Patient Instructions (Signed)
Medication Instructions:   Your physician recommends that you continue on your current medications as directed. Please refer to the Current Medication list given to you today.  *If you need a refill on your cardiac medications before your next appointment, please call your pharmacy*    Follow-Up: At [facility], you and your health needs are our priority.  As part of our continuing mission to provide you with exceptional heart care, we have created designated Provider Care Teams.  These Care Teams include your primary Cardiologist (physician) and Advanced Practice Providers (APPs -  Physician Assistants and Nurse Practitioners) who all work together to provide you with the care you need, when you need it.  We recommend signing up for the patient portal called "MyChart".  Sign up information is provided on this After Visit Summary.  MyChart is used to connect with patients for Virtual Visits (Telemedicine).  Patients are able to view lab/test results, encounter notes, upcoming appointments, etc.  Non-urgent messages can be sent to your provider as well.   To learn more about what you can do with MyChart, go to [email or web address].    Your next appointment:   6-12 month(s)  The format for your next appointment:   In Person  Provider:   You may see Debbe Odea, MD or one of the following Advanced Practice Providers on your designated Care Team:   Nicolasa Ducking, NP Eula Listen, PA-C Cadence Fransico Michael, [place]    Other Instructions   Important Information About Sugar

## 2021-12-01 ENCOUNTER — Encounter: Payer: Self-pay | Admitting: Cardiology

## 2021-12-01 ENCOUNTER — Encounter: Payer: Self-pay | Admitting: Emergency Medicine

## 2021-12-01 ENCOUNTER — Ambulatory Visit
Admission: EM | Admit: 2021-12-01 | Discharge: 2021-12-01 | Disposition: A | Discharge status: Home / Self Care | Payer: PRIVATE HEALTH INSURANCE | Attending: Family Medicine | Admitting: Family Medicine

## 2021-12-01 DIAGNOSIS — J069 Acute upper respiratory infection, unspecified: Secondary | ICD-10-CM

## 2021-12-01 DIAGNOSIS — Z20822 Contact with and (suspected) exposure to covid-19: Secondary | ICD-10-CM

## 2021-12-01 LAB — POCT RAPID STREP A (OFFICE): Rapid Strep A Screen: NEGATIVE

## 2021-12-01 NOTE — ED Triage Notes (Signed)
Pt here with sore throat and headache x 3 days.

## 2021-12-01 NOTE — ED Provider Notes (Signed)
Joseph Joseph    CSN: [id number] Arrival date & time: 12/01/21  1406      History   Chief Complaint Chief Complaint  Patient presents with   Sore Throat   Headache    HPI Joseph Joseph is a 26 y.o. male.   HPI Patient presents with URI symptoms including sore throat, nasal congestion, post nasal drainage, and headache x 3 days. Exposed to people at work with similar symptoms. He has not taken any medication for symptoms and is afebrile.  Past Medical History:  Diagnosis Date   Anxiety    GERD (gastroesophageal reflux disease)    PAF (paroxysmal atrial fibrillation) (HCC)    a. 07/2021 s/p DCCV; b. 08/2021 Zio: no recurrent Afib; c. CHA2DS2VASc = 0.    Patient Active Problem List   Diagnosis Date Noted   BMI 32.0-32.9,adult 11/01/2018    History reviewed. No pertinent surgical history.     Home Medications    Prior to Admission medications   Medication Sig Start Date End Date Taking? Authorizing Provider  busPIRone (BUSPAR) 7.5 MG tablet Take 7.5 mg by mouth 3 (three) times daily.    [provider]  metoprolol succinate (TOPROL XL) 50 MG 24 hr tablet Take 1 tablet (50 mg total) by mouth daily. Take with or immediately following a meal. 11/13/21 12/14/22  Debbe Odea, MD  pantoprazole (PROTONIX) 40 MG tablet Take 1 tablet (40 mg total) by mouth daily. 04/08/21   Theadora Rama Scales, PA-C    Family History History reviewed. No pertinent family history.  Social History Social History   Tobacco Use   Smoking status: Former   Smokeless tobacco: Never  Building services engineer Use: Never used  Substance Use Topics   Alcohol use: Not Currently    Comment: 4-5 drinks per week   Drug use: Never     Allergies   Rocephin [ceftriaxone]   Review of Systems Review of Systems Pertinent negatives listed in HPI  Physical Exam Triage Vital Signs ED Triage Vitals  Enc Vitals Group     BP 12/01/21 1424 110/73     Pulse Rate 12/01/21 1424  67     Resp 12/01/21 1424 18     Temp 12/01/21 1424 97.9 F (36.6 C)     Temp src --      SpO2 12/01/21 1424 98 %     Weight --      Height --      Head Circumference --      Peak Flow --      Pain Score 12/01/21 1433 5     Pain Loc --      Pain Edu? --      Excl. in GC? --    No data found.  Updated Vital Signs BP 110/73   Pulse 67   Temp 97.9 F (36.6 C)   Resp 18   SpO2 98%   Visual Acuity Right Eye Distance:   Left Eye Distance:   Bilateral Distance:    Right Eye Near:   Left Eye Near:    Bilateral Near:     Physical Exam  General Appearance:    Alert, cooperative, no distress  HENT:   Normocephalic, ears normal, nares mucosal edema with congestion, oropharynx  mild erythema w/o exudate with cervical adenopathy   Eyes:    PERRL, conjunctiva/corneas clear, EOM's intact       Lungs:     Clear to auscultation bilaterally, respirations unlabored  Heart:  Regular rate and rhythm  Neurologic:   Awake, alert, oriented x 3. No apparent focal neurological           defect.      UC Treatments / Results  Labs (all labs ordered are listed, but only abnormal results are displayed) Labs Reviewed  COVID-19, FLU A+B NAA  POCT RAPID STREP A (OFFICE)    EKG   Radiology No results found.  Procedures Procedures (including critical care time)  Medications Ordered in UC Medications - No data to display  Initial Impression / Assessment and Plan / UC Course  I have reviewed the triage vital signs and the nursing notes.  Pertinent labs & imaging results that were available during my care of the patient were reviewed by me and considered in my medical decision making (see chart for details).    COVID/Flu test pending. Symptom management warranted only.  Manage fever with Tylenol and ibuprofen.  Nasal symptoms with over-the-counter antihistamines recommended.  Treatment per discharge medications/discharge instructions.  Red flags/ER precautions given. The most current  CDC isolation/quarantine recommendation advised.   Final Clinical Impressions(s) / UC Diagnoses   Final diagnoses:  Encounter for laboratory testing for COVID-19 virus  Viral URI     Discharge Instructions      COVID-19 /Influenza test pending., within 48-72 hours.  Our office will contact you if your test results are positive only.  All test results will update on your MyChart.  As discussed Tylenol or ibuprofen as needed for pain.  For your nasal symptoms recommend over-the-counter Zyrtec or Xyzal.  Nasal spray such as Flonase can also help with nasal congestion.  Hydrate well with fluids.  If symptoms worsen return for evaluation.  Strep test was negative.     ED Prescriptions   None    PDMP not reviewed this encounter.   Bing Neighbors, FNP 12/01/21 [phone]

## 2021-12-01 NOTE — Discharge Instructions (Signed)
COVID-19 /Influenza test pending., within 48-72 hours.  Our office will contact you if your test results are positive only.  All test results will update on your MyChart.  As discussed Tylenol or ibuprofen as needed for pain.  For your nasal symptoms recommend over-the-counter Zyrtec or Xyzal.  Nasal spray such as Flonase can also help with nasal congestion.  Hydrate well with fluids.  If symptoms worsen return for evaluation.  Strep test was negative.

## 2021-12-02 LAB — COVID-19, FLU A+B NAA
Influenza A, NAA: NOT DETECTED
Influenza B, NAA: NOT DETECTED
SARS-CoV-2, NAA: NOT DETECTED

## 2021-12-04 ENCOUNTER — Ambulatory Visit
Admission: EM | Admit: 2021-12-04 | Discharge: 2021-12-04 | Disposition: A | Discharge status: Home / Self Care | Payer: PRIVATE HEALTH INSURANCE | Attending: Emergency Medicine | Admitting: Emergency Medicine

## 2021-12-04 DIAGNOSIS — B349 Viral infection, unspecified: Secondary | ICD-10-CM

## 2021-12-04 DIAGNOSIS — R112 Nausea with vomiting, unspecified: Secondary | ICD-10-CM | POA: Diagnosis not present

## 2021-12-04 DIAGNOSIS — J029 Acute pharyngitis, unspecified: Secondary | ICD-10-CM | POA: Diagnosis not present

## 2021-12-04 DIAGNOSIS — R197 Diarrhea, unspecified: Secondary | ICD-10-CM | POA: Diagnosis not present

## 2021-12-04 LAB — POCT RAPID STREP A (OFFICE): Rapid Strep A Screen: NEGATIVE

## 2021-12-04 MED ORDER — ONDANSETRON 4 MG PO TBDP: 4.0000 mg | ORAL_TABLET | Freq: Three times a day (TID) | ORAL | 0 refills | Status: DC | PRN

## 2021-12-04 NOTE — ED Provider Notes (Signed)
Joseph Joseph    CSN: [id number] Arrival date & time: 12/04/21  1009      History   Chief Complaint Chief Complaint  Patient presents with   Sore Throat   Nausea   Diarrhea   Fever    HPI Joseph Joseph is a 26 y.o. male.  Patient presents with fever, nausea, vomiting, diarrhea today.  He also reports sore throat and mild cough x 3 to 4 days that have improved.  Tmax 101 this morning; patient took Coricidin HBP.  1 episode of emesis and 1 episode of diarrhea.  He denies rash, shortness of breath, chest pain, abdominal pain, or other symptoms.  Patient was seen at this urgent care on 12/01/2021; diagnosed with viral URI; negative for strep, COVID, and flu; treated symptomatically.  His medical history includes atrial fibrillation, GERD, anxiety.  The history is provided by the patient and medical records.    Past Medical History:  Diagnosis Date   Anxiety    GERD (gastroesophageal reflux disease)    PAF (paroxysmal atrial fibrillation) (HCC)    a. 07/2021 s/p DCCV; b. 08/2021 Zio: no recurrent Afib; c. CHA2DS2VASc = 0.    Patient Active Problem List   Diagnosis Date Noted   BMI 32.0-32.9,adult 11/01/2018    History reviewed. No pertinent surgical history.     Home Medications    Prior to Admission medications   Medication Sig Start Date End Date Taking? Authorizing Provider  ondansetron (ZOFRAN-ODT) 4 MG disintegrating tablet Take 1 tablet (4 mg total) by mouth every 8 (eight) hours as needed for nausea or vomiting. 12/04/21  Yes Mickie Bail, NP  busPIRone (BUSPAR) 7.5 MG tablet Take 7.5 mg by mouth 3 (three) times daily.    [provider]  metoprolol succinate (TOPROL XL) 50 MG 24 hr tablet Take 1 tablet (50 mg total) by mouth daily. Take with or immediately following a meal. 11/13/21 12/14/22  Debbe Odea, MD  pantoprazole (PROTONIX) 40 MG tablet Take 1 tablet (40 mg total) by mouth daily. 04/08/21   Theadora Rama Scales, PA-C    Family  History History reviewed. No pertinent family history.  Social History Social History   Tobacco Use   Smoking status: Former   Smokeless tobacco: Never  Building services engineer Use: Never used  Substance Use Topics   Alcohol use: Not Currently    Comment: 4-5 drinks per week   Drug use: Never     Allergies   Rocephin [ceftriaxone]   Review of Systems Review of Systems  Constitutional:  Positive for fever. Negative for chills.  HENT:  Positive for sore throat. Negative for ear pain.   Respiratory:  Positive for cough. Negative for shortness of breath.   Cardiovascular:  Negative for chest pain and palpitations.  Gastrointestinal:  Positive for diarrhea, nausea and vomiting. Negative for abdominal pain.  Skin:  Negative for color change and rash.  All other systems reviewed and are negative.    Physical Exam Triage Vital Signs ED Triage Vitals  Enc Vitals Group     BP      Pulse      Resp      Temp      Temp src      SpO2      Weight      Height      Head Circumference      Peak Flow      Pain Score      Pain Loc  Pain Edu?      Excl. in GC?    No data found.  Updated Vital Signs BP 128/78   Pulse 93   Temp 99.4 F (37.4 C)   Resp 18   SpO2 97%   Visual Acuity Right Eye Distance:   Left Eye Distance:   Bilateral Distance:    Right Eye Near:   Left Eye Near:    Bilateral Near:     Physical Exam Vitals and nursing note reviewed.  Constitutional:      General: He is not in acute distress.    Appearance: Normal appearance. He is well-developed. He is not ill-appearing.  HENT:     Right Ear: Tympanic membrane normal.     Left Ear: Tympanic membrane normal.     Nose: Nose normal.     Mouth/Throat:     Mouth: Mucous membranes are moist.     Pharynx: Oropharynx is clear.  Cardiovascular:     Rate and Rhythm: Normal rate and regular rhythm.     Heart sounds: Normal heart sounds.  Pulmonary:     Effort: Pulmonary effort is normal. No  respiratory distress.     Breath sounds: Normal breath sounds.  Abdominal:     General: Bowel sounds are normal.     Palpations: Abdomen is soft.     Tenderness: There is no abdominal tenderness. There is no guarding or rebound.  Musculoskeletal:     Cervical back: Neck supple.  Skin:    General: Skin is warm and dry.  Neurological:     Mental Status: He is alert.  Psychiatric:        Mood and Affect: Mood normal.        Behavior: Behavior normal.      UC Treatments / Results  Labs (all labs ordered are listed, but only abnormal results are displayed) Labs Reviewed  POCT RAPID STREP A (OFFICE)    EKG   Radiology No results found.  Procedures Procedures (including critical care time)  Medications Ordered in UC Medications - No data to display  Initial Impression / Assessment and Plan / UC Course  I have reviewed the triage vital signs and the nursing notes.  Pertinent labs & imaging results that were available during my care of the patient were reviewed by me and considered in my medical decision making (see chart for details).  Viral illness, nausea vomiting and diarrhea, sore throat.  Rapid strep negative.  Treating nausea and vomiting with Zofran.  Discussed clear liquid diet.  Instructed patient to advance to diarrhea diet as tolerated.  Discussed maintaining oral hydration at home; ED precautions discussed.  Education provided on nausea and vomiting, diarrhea.  Instructed patient to follow-up with his PCP as needed.  He agrees to plan of care.    Final Clinical Impressions(s) / UC Diagnoses   Final diagnoses:  Viral illness  Nausea vomiting and diarrhea  Sore throat     Discharge Instructions      Take the antinausea medication as directed.    Keep yourself hydrated with clear liquids, such as water and Gatorade.  Follow the diarrhea diet as tolerated.   Follow up with your primary care provider if your symptoms are not improving.          ED  Prescriptions     Medication Sig Dispense Auth. Provider   ondansetron (ZOFRAN-ODT) 4 MG disintegrating tablet Take 1 tablet (4 mg total) by mouth every 8 (eight) hours as needed for nausea  or vomiting. 20 tablet Mickie Bail, NP      PDMP not reviewed this encounter.   Mickie Bail, NP 12/04/21 1053

## 2021-12-04 NOTE — ED Triage Notes (Signed)
Patient presents to Urgent Care with complaints of fever, sore throat, nausea, and diarrhea x 2 days.

## 2021-12-04 NOTE — Discharge Instructions (Addendum)
Take the antinausea medication as directed.    Keep yourself hydrated with clear liquids, such as water and Gatorade.  Follow the diarrhea diet as tolerated.   Follow up with your primary care provider if your symptoms are not improving.

## 2022-01-11 ENCOUNTER — Ambulatory Visit: Payer: PRIVATE HEALTH INSURANCE | Admitting: Internal Medicine

## 2022-01-11 ENCOUNTER — Encounter: Payer: Self-pay | Admitting: Internal Medicine

## 2022-01-11 VITALS — BP 122/76 | HR 77 | Temp 97.5°F | Resp 16 | Ht 68.0 in | Wt 200.6 lb

## 2022-01-11 DIAGNOSIS — I480 Paroxysmal atrial fibrillation: Secondary | ICD-10-CM | POA: Diagnosis not present

## 2022-01-11 DIAGNOSIS — K219 Gastro-esophageal reflux disease without esophagitis: Secondary | ICD-10-CM | POA: Diagnosis not present

## 2022-01-11 DIAGNOSIS — F419 Anxiety disorder, unspecified: Secondary | ICD-10-CM | POA: Diagnosis not present

## 2022-01-11 MED ORDER — BUSPIRONE HCL 7.5 MG PO TABS: 7.5000 mg | ORAL_TABLET | Freq: Two times a day (BID) | ORAL | 5 refills | Status: DC

## 2022-01-11 MED ORDER — PANTOPRAZOLE SODIUM 40 MG PO TBEC: 40.0000 mg | DELAYED_RELEASE_TABLET | Freq: Every day | ORAL | 1 refills | Status: DC

## 2022-01-11 MED ORDER — METOPROLOL SUCCINATE ER 50 MG PO TB24: 50.0000 mg | ORAL_TABLET | Freq: Every day | ORAL | 1 refills | Status: DC

## 2022-01-11 NOTE — Patient Instructions (Addendum)
It was great seeing you today!  Plan discussed at today's visit: -Continue Pantoprazole 40 mg daily for 3 months, then decrease to 20 mg daily. If you have break through symptoms with this dose, let me know and I can refer to GI for scope to see why the acid reflux is uncontrolled.  -Continue all other medications -Plan for fasting labs next time  Follow up in: 6 months   Take care and let us know if you have any questions or concerns prior to your next visit.  Dr. Rosana Berger  Gastroesophageal Reflux Disease, Adult Gastroesophageal reflux (GER) happens when acid from the stomach flows up into the tube that connects the mouth and the stomach (esophagus). Normally, food travels down the esophagus and stays in the stomach to be digested. However, when a person has GER, food and stomach acid sometimes move back up into the esophagus. If this becomes a more serious problem, the person may be diagnosed with a disease called gastroesophageal reflux disease (GERD). GERD occurs when the reflux: Happens often. Causes frequent or severe symptoms. Causes problems such as damage to the esophagus. When stomach acid comes in contact with the esophagus, the acid may cause inflammation in the esophagus. Over time, GERD may create small holes (ulcers) in the lining of the esophagus. What are the causes? This condition is caused by a problem with the muscle between the esophagus and the stomach (lower esophageal sphincter, or LES). Normally, the LES muscle closes after food passes through the esophagus to the stomach. When the LES is weakened or abnormal, it does not close properly, and that allows food and stomach acid to go back up into the esophagus. The LES can be weakened by certain dietary substances, medicines, and medical conditions, including: Tobacco use. Pregnancy. Having a hiatal hernia. Alcohol use. Certain foods and beverages, such as coffee, chocolate, onions, and peppermint. What increases the  risk? You are more likely to develop this condition if you: Have an increased body weight. Have a connective tissue disorder. Take NSAIDs, such as ibuprofen. What are the signs or symptoms? Symptoms of this condition include: Heartburn. Difficult or painful swallowing and the feeling of having a lump in the throat. A bitter taste in the mouth. Bad breath and having a large amount of saliva. Having an upset or bloated stomach and belching. Chest pain. Different conditions can cause chest pain. Make sure you see your health care provider if you experience chest pain. Shortness of breath or wheezing. Ongoing (chronic) cough or a nighttime cough. Wearing away of tooth enamel. Weight loss. How is this diagnosed? This condition may be diagnosed based on a medical history and a physical exam. To determine if you have mild or severe GERD, your health care provider may also monitor how you respond to treatment. You may also have tests, including: A test to examine your stomach and esophagus with a small camera (endoscopy). A test that measures the acidity level in your esophagus. A test that measures how much pressure is on your esophagus. A barium swallow or modified barium swallow test to show the shape, size, and functioning of your esophagus. How is this treated? Treatment for this condition may vary depending on how severe your symptoms are. Your health care provider may recommend: Changes to your diet. Medicine. Surgery. The goal of treatment is to help relieve your symptoms and to prevent complications. Follow these instructions at home: Eating and drinking  Follow a diet as recommended by your health care provider.  This may involve avoiding foods and drinks such as: Coffee and tea, with or without caffeine. Drinks that contain alcohol. Energy drinks and sports drinks. Carbonated drinks or sodas. Chocolate and cocoa. Peppermint and mint flavorings. Garlic and  onions. Horseradish. Spicy and acidic foods, including peppers, chili powder, curry powder, vinegar, hot sauces, and barbecue sauce. Citrus fruit juices and citrus fruits, such as oranges, lemons, and limes. Tomato-based foods, such as red sauce, chili, salsa, and pizza with red sauce. Fried and fatty foods, such as donuts, french fries, potato chips, and high-fat dressings. High-fat meats, such as hot dogs and fatty cuts of red and white meats, such as rib eye steak, sausage, ham, and bacon. High-fat dairy items, such as whole milk, butter, and cream cheese. Eat small, frequent meals instead of large meals. Avoid drinking large amounts of liquid with your meals. Avoid eating meals during the 2-3 hours before bedtime. Avoid lying down right after you eat. Do not exercise right after you eat. Lifestyle  Do not use any products that contain nicotine or tobacco. These products include cigarettes, chewing tobacco, and vaping devices, such as e-cigarettes. If you need help quitting, ask your health care provider. Try to reduce your stress by using methods such as yoga or meditation. If you need help reducing stress, ask your health care provider. If you are overweight, reduce your weight to an amount that is healthy for you. Ask your health care provider for guidance about a safe weight loss goal. General instructions Pay attention to any changes in your symptoms. Take over-the-counter and prescription medicines only as told by your health care provider. Do not take aspirin, ibuprofen, or other NSAIDs unless your health care provider told you to take these medicines. Wear loose-fitting clothing. Do not wear anything tight around your waist that causes pressure on your abdomen. Raise (elevate) the head of your bed about 6 inches (15 cm). You can use a wedge to do this. Avoid bending over if this makes your symptoms worse. Keep all follow-up visits. This is important. Contact a health care provider  if: You have: New symptoms. Unexplained weight loss. Difficulty swallowing or it hurts to swallow. Wheezing or a persistent cough. A hoarse voice. Your symptoms do not improve with treatment. Get help right away if: You have sudden pain in your arms, neck, jaw, teeth, or back. You suddenly feel sweaty, dizzy, or light-headed. You have chest pain or shortness of breath. You vomit and the vomit is green, yellow, or black, or it looks like blood or coffee grounds. You faint. You have stool that is red, bloody, or black. You cannot swallow, drink, or eat. These symptoms may represent a serious problem that is an emergency. Do not wait to see if the symptoms will go away. Get medical help right away. Call your local emergency services (911 in the [place] Do not drive yourself to the hospital. Summary Gastroesophageal reflux happens when acid from the stomach flows up into the esophagus. GERD is a disease in which the reflux happens often, causes frequent or severe symptoms, or causes problems such as damage to the esophagus. Treatment for this condition may vary depending on how severe your symptoms are. Your health care provider may recommend diet and lifestyle changes, medicine, or surgery. Contact a health care provider if you have new or worsening symptoms. Take over-the-counter and prescription medicines only as told by your health care provider. Do not take aspirin, ibuprofen, or other NSAIDs unless your health care provider told  you to do so. Keep all follow-up visits as told by your health care provider. This is important. This information is not intended to replace advice given to you by your health care provider. Make sure you discuss any questions you have with your health care provider. Document Revised: 10/22/2019 Document Reviewed: 10/22/2019 Elsevier Patient Education  [place].

## 2022-01-11 NOTE — Progress Notes (Signed)
New Patient Office Visit  Subjective    Patient ID: Joseph Joseph, male    DOB: November 23, 1995  Age: 26 y.o. MRN: [id number]  CC:  Chief Complaint  Patient presents with   Establish Care    HPI Joseph Joseph presents to establish care.  Paroxysmal A.Fib: -Following with Cardiology, last seen on 11/13/21 -Underwent DC cardioversion in April 2023 when he was awoken from sleep with palpitations and tachycardia. His apple watch alerted to him being in A. Fib and he was diaphoretic and went to the ER where he was cardioverted.  -Echo 09/04/21 showing EF 55-60% -Currently on Metoprolol XL 50 mg, had been on Eliquis but no longer, EP discontinued it after his cardioversion  -Does occasionally get palpations like a skipped beat about only once every few weeks. Denies chest pain, shortness of breath or other A. Fib episodes   Anxiety: -Currently on Buspar 7.5 mg BID, controlled   GERD: -Currently on Pantoprazole 40 mg daily, tried to decrease to 20 mg daily but had break through symptoms -Had acid reflux for about 1 year, has epigastric burning pain when symptomatic, no nausea or vomiting. Appetite good, no changes in weight.   Health Maintenance: -Blood work UTD  Outpatient Encounter Medications as of 01/11/2022  Medication Sig   busPIRone (BUSPAR) 7.5 MG tablet Take 7.5 mg by mouth 3 (three) times daily.   metoprolol succinate (TOPROL-XL) 50 MG 24 hr tablet Take 50 mg by mouth daily. Take with or immediately following a meal.   pantoprazole (PROTONIX) 40 MG tablet Take 1 tablet (40 mg total) by mouth daily.   [DISCONTINUED] ondansetron (ZOFRAN-ODT) 4 MG disintegrating tablet Take 1 tablet (4 mg total) by mouth every 8 (eight) hours as needed for nausea or vomiting.   [DISCONTINUED] metoprolol succinate (TOPROL XL) 50 MG 24 hr tablet Take 1 tablet (50 mg total) by mouth daily. Take with or immediately following a meal.   No facility-administered encounter medications on file as of  01/11/2022.    Past Medical History:  Diagnosis Date   Anxiety    GERD (gastroesophageal reflux disease)    Hypertension    PAF (paroxysmal atrial fibrillation) ([place])    a. 07/2021 s/p DCCV; b. 08/2021 Zio: no recurrent Afib; c. CHA2DS2VASc = 0.    History reviewed. No pertinent surgical history.  Family History  Problem Relation Age of Onset   Heart disease Mother    GER disease Mother     Social History   Socioeconomic History   Marital status: Single    Spouse name: Not on file   Number of children: Not on file   Years of education: Not on file   Highest education level: Not on file  Occupational History   Not on file  Tobacco Use   Smoking status: Former   Smokeless tobacco: Never  Vaping Use   Vaping Use: Never used  Substance and Sexual Activity   Alcohol use: Not Currently    Comment: 4-5 drinks per week   Drug use: Never   Sexual activity: Yes  Other Topics Concern   Not on file  Social History Narrative   Not on file   Social Determinants of Health   Financial Resource Strain: Not on file  Food Insecurity: Not on file  Transportation Needs: Not on file  Physical Activity: Not on file  Stress: Not on file  Social Connections: Not on file  Intimate Partner Violence: Not on file    Review of Systems  Constitutional:  Negative for chills and fever.  Respiratory:  Negative for shortness of breath.   Cardiovascular:  Positive for palpitations. Negative for chest pain.  Gastrointestinal:  Positive for heartburn. Negative for abdominal pain, blood in stool, constipation, diarrhea, nausea and vomiting.  Genitourinary:  Negative for hematuria.        Objective    BP 122/76   Pulse 77   Temp (!) 97.5 F (36.4 C)   Resp 16   Ht 5\' 8"  (1.727 m)   Wt 200 lb 9.6 oz (91 kg)   SpO2 96%   BMI 30.50 kg/m   Physical Exam Constitutional:      Appearance: Normal appearance.  HENT:     Head: Normocephalic and atraumatic.     Mouth/Throat:      Mouth: Mucous membranes are moist.     Pharynx: Oropharynx is clear.  Eyes:     Extraocular Movements: Extraocular movements intact.     Conjunctiva/sclera: Conjunctivae normal.     Pupils: Pupils are equal, round, and reactive to light.  Cardiovascular:     Rate and Rhythm: Normal rate and regular rhythm.     Heart sounds: No murmur heard. Pulmonary:     Effort: Pulmonary effort is normal.     Breath sounds: Normal breath sounds.  Musculoskeletal:     Right lower leg: No edema.     Left lower leg: No edema.  Skin:    General: Skin is warm and dry.  Neurological:     General: No focal deficit present.     Mental Status: He is alert. Mental status is at baseline.  Psychiatric:        Mood and Affect: Mood normal.        Behavior: Behavior normal.         Assessment & Plan:   1. Paroxysmal atrial fibrillation [facility]): S/p cardioversion in April. Off anticoagulation, continue Metoprolol XL 50 mg daily.   - metoprolol succinate (TOPROL-XL) 50 MG 24 hr tablet; Take 1 tablet (50 mg total) by mouth daily. Take with or immediately following a meal.  Dispense: 90 tablet; Refill: 1  2. Anxiety: Controlled, continue Buspar 7.5 mg BID, refilled today.   - busPIRone (BUSPAR) 7.5 MG tablet; Take 1 tablet (7.5 mg total) by mouth 2 (two) times daily.  Dispense: 60 tablet; Refill: 5  3. Gastroesophageal reflux disease, unspecified whether esophagitis present: Continue Protonix 40 mg daily for another 3 months, then patient will reduce dose to 20 mg daily. If having break through symptoms, will refer to GI for EGD.   - pantoprazole (PROTONIX) 40 MG tablet; Take 1 tablet (40 mg total) by mouth daily.  Dispense: 90 tablet; Refill: 1  Return in about 6 months (around 07/12/2022).   07/14/2022, DO

## 2022-01-28 IMAGING — CT CT HEAD W/O CM
3 series · 16 of 47 positions shown, 19 images · non-contrast
Comparison: None.

CLINICAL DATA: Headache, new or worsening, neuro deficit (Age
19-49y) Headache x1 month that is getting progressively worse

EXAM:
CT HEAD WITHOUT CONTRAST
TECHNIQUE: Contiguous axial images were obtained from the base of the skull
through the vertex without intravenous contrast.

[Series 2: head wo · axial · 0.46mm/px · z∈[+215,+365]mm · 10 of 36 slices shown, 13 images]
[im 3/36  brain]
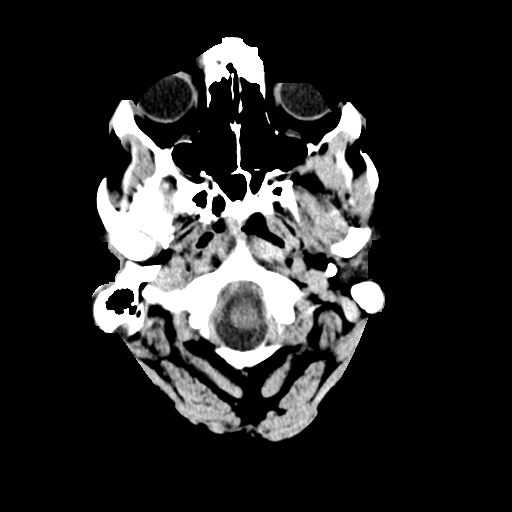
[im 3/36  bone]
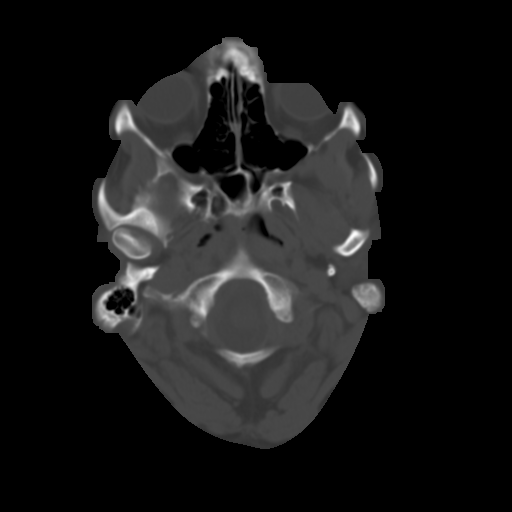
[im 7/36  brain]
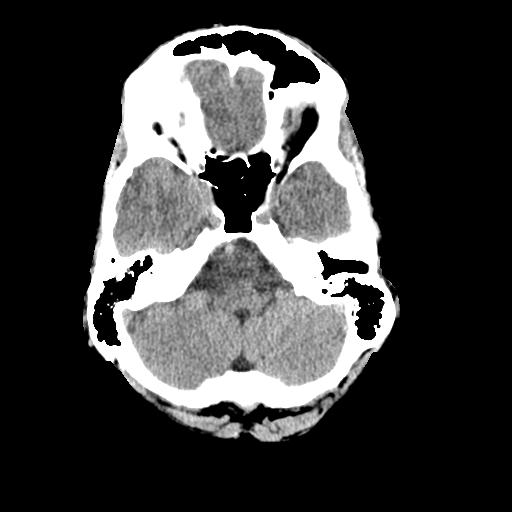
[im 10/36  brain]
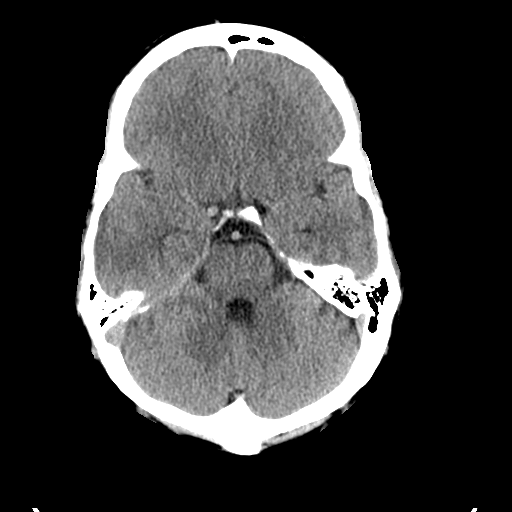
[im 13/36  brain]
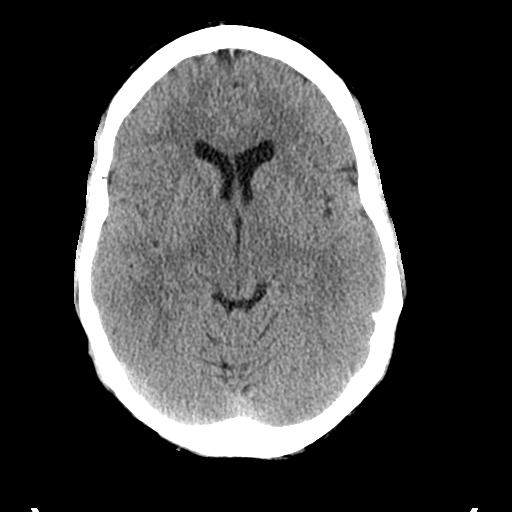
[im 16/36  brain]
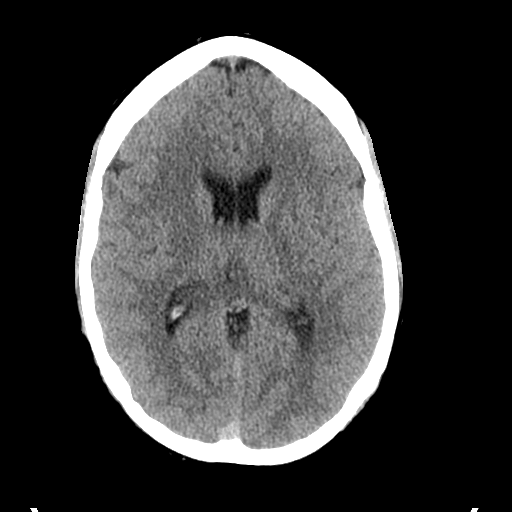
[im 16/36  bone]
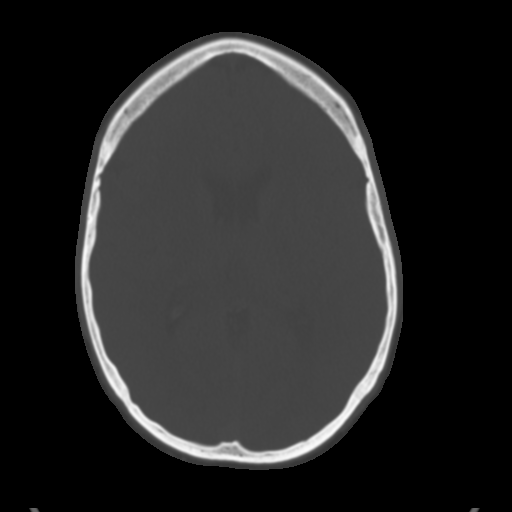
[im 20/36  brain]
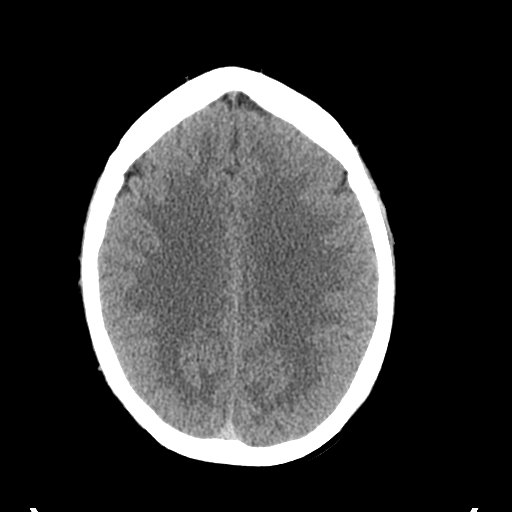
[im 23/36  brain]
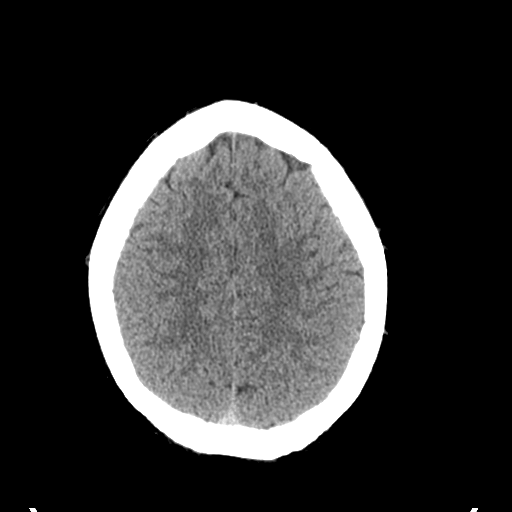
[im 27/36  brain]
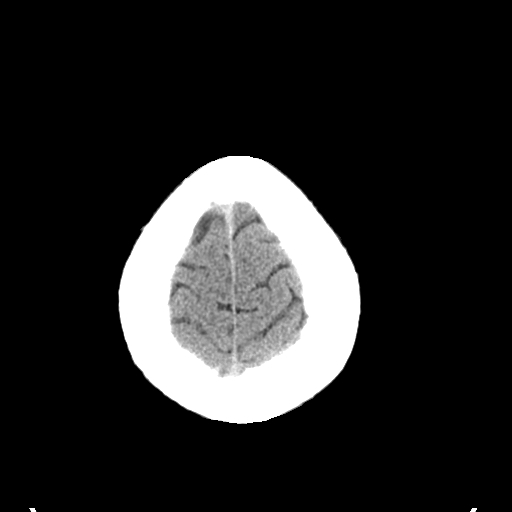
[im 29/36  brain]
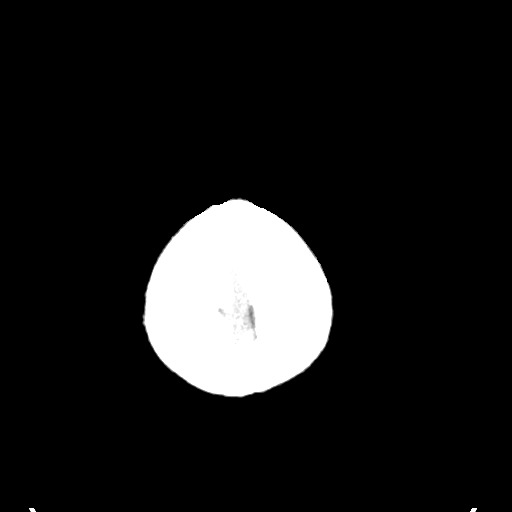
[im 29/36  bone]
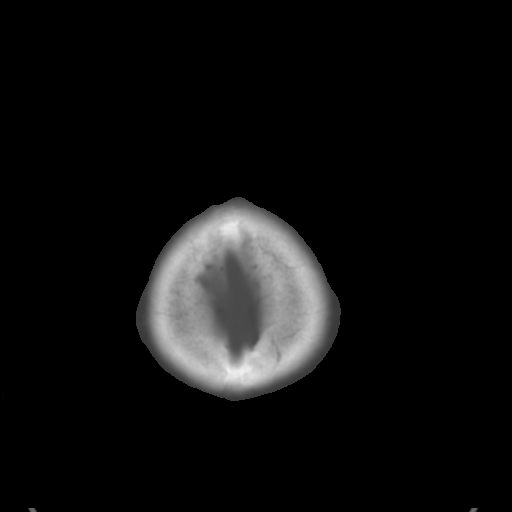
[im 33/36  brain]
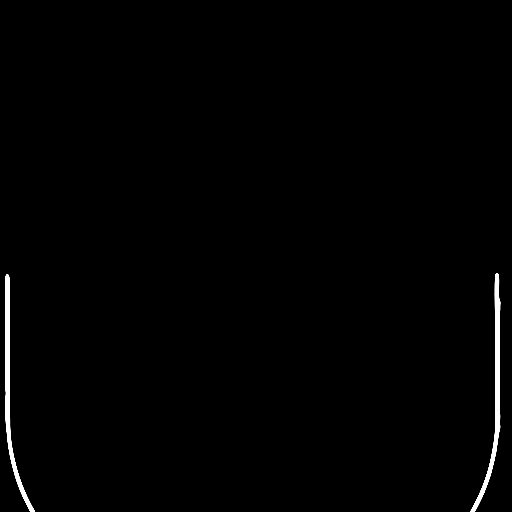

[Series 4: coronal soft tissue · coronal · 0.33mm/px · 3 of 72 slices shown]
[im 24/72  brain]
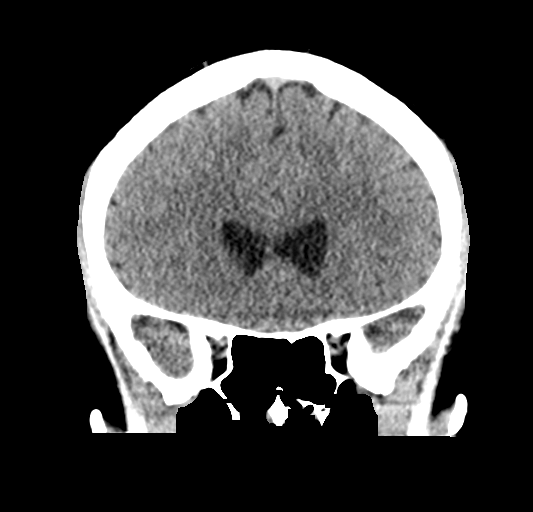
[im 32/72  brain]
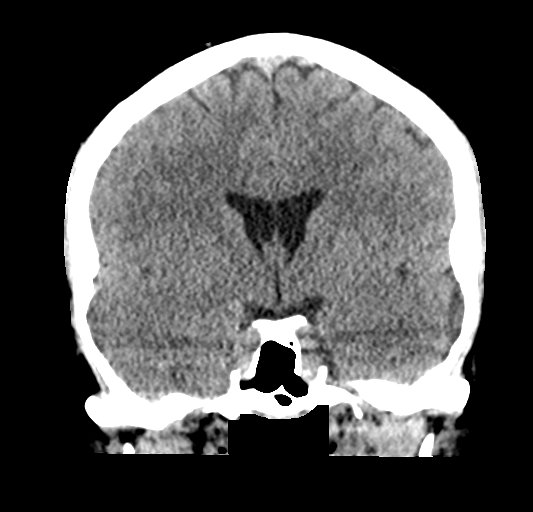
[im 40/72  brain]
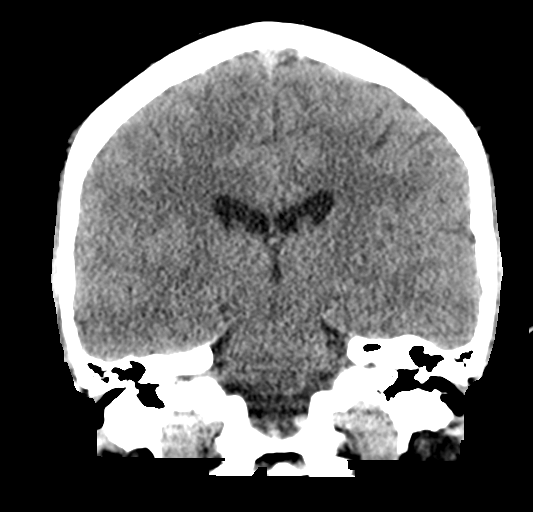

[Series 5: sagittal soft tissue · sagittal · 0.33mm/px · 3 of 60 slices shown]
[im 20/60  brain]
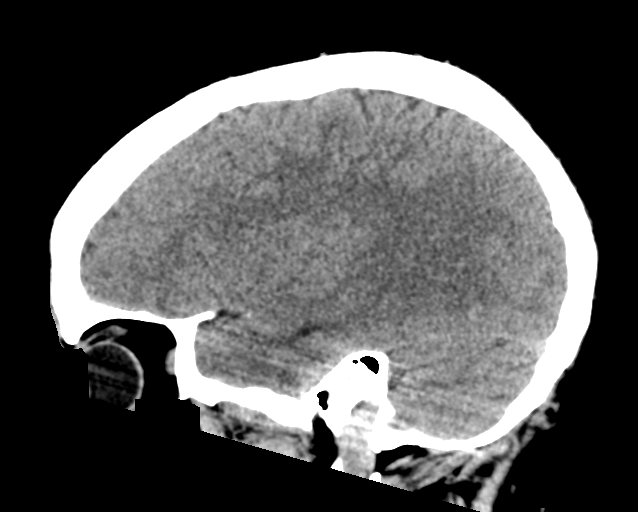
[im 30/60  brain]
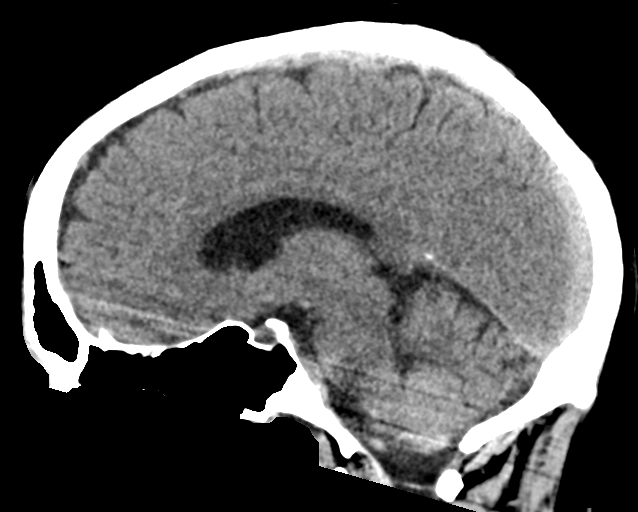
[im 40/60  brain]
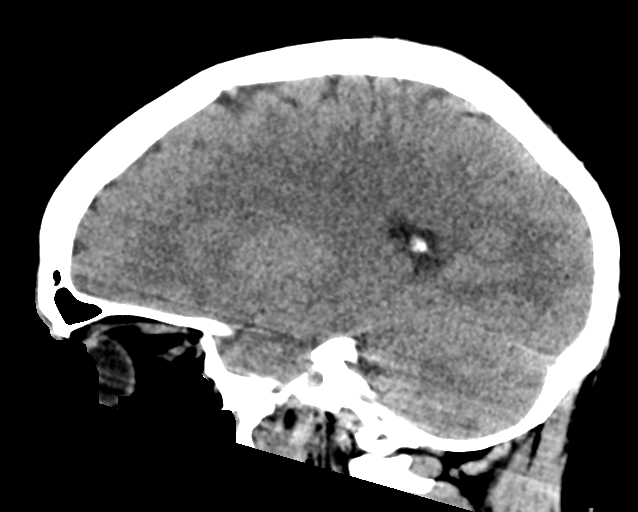

[16 of 47 positions shown; findings below may reference images not displayed]

FINDINGS: Brain: Normal anatomic configuration. No abnormal intra or
extra-axial mass lesion or fluid collection. No abnormal mass effect
or midline shift. No evidence of acute intracranial hemorrhage or
infarct. Ventricular size is normal. Cerebellum unremarkable.

Vascular: Unremarkable

Skull: Intact

Sinuses/Orbits: Paranasal sinuses are clear. Orbits are
unremarkable.

Other: Mastoid air cells and middle ear cavities are clear.
IMPRESSION: No acute intracranial abnormality.  Normal examination.

## 2022-02-06 ENCOUNTER — Other Ambulatory Visit: Payer: Self-pay | Admitting: Internal Medicine

## 2022-02-06 DIAGNOSIS — F419 Anxiety disorder, unspecified: Secondary | ICD-10-CM

## 2022-02-08 NOTE — Telephone Encounter (Signed)
Rx 01/11/22 #60 5RF- 6 month supply- can give 90 day supply Requested Prescriptions  Pending Prescriptions Disp Refills  . busPIRone (BUSPAR) 7.5 MG tablet [Pharmacy Med Name: BUSPIRONE HCL 7.5 MG TABLET] 180 tablet 2    Sig: TAKE 1 TABLET BY MOUTH 2 TIMES DAILY.     Psychiatry: Anxiolytics/Hypnotics - Non-controlled Passed - 02/06/2022  2:33 PM      Passed - Valid encounter within last 12 months    Recent Outpatient Visits          4 weeks ago Paroxysmal atrial fibrillation [facility])   [facility] Teodora Medici, DO      Future Appointments            In 5 months Teodora Medici, [facility], [facility]

## 2022-03-06 ENCOUNTER — Encounter: Payer: Self-pay | Admitting: Internal Medicine

## 2022-03-26 ENCOUNTER — Other Ambulatory Visit: Payer: Self-pay | Admitting: Internal Medicine

## 2022-03-26 DIAGNOSIS — F419 Anxiety disorder, unspecified: Secondary | ICD-10-CM

## 2022-03-26 MED ORDER — HYDROXYZINE HCL 10 MG PO TABS: 10.0000 mg | ORAL_TABLET | Freq: Two times a day (BID) | ORAL | 0 refills | Status: DC | PRN

## 2022-04-09 ENCOUNTER — Other Ambulatory Visit: Payer: Self-pay | Admitting: Internal Medicine

## 2022-04-09 DIAGNOSIS — F419 Anxiety disorder, unspecified: Secondary | ICD-10-CM

## 2022-04-09 NOTE — Telephone Encounter (Signed)
Requested Prescriptions  Pending Prescriptions Disp Refills   hydrOXYzine (ATARAX) 10 MG tablet [Pharmacy Med Name: HYDROXYZINE HCL 10 MG TABLET] 180 tablet 2    Sig: TAKE 1 TABLET BY MOUTH EVERY 12 HOURS AS NEEDED FOR ANXIETY.     Ear, Nose, and Throat:  Antihistamines 2 Passed - 04/09/2022  8:57 AM      Passed - Cr in normal range and within 360 days    Creatinine, Ser  Date Value Ref Range Status  09/18/2021 0.73 0.61 - 1.24 mg/dL Final         Passed - Valid encounter within last 12 months    Recent Outpatient Visits           2 months ago Paroxysmal atrial fibrillation [facility])   [facility] Margarita Mail, DO       Future Appointments             In 3 months Margarita Mail, DO [facility], [facility]

## 2022-04-15 ENCOUNTER — Encounter: Payer: Self-pay | Admitting: Internal Medicine

## 2022-04-16 ENCOUNTER — Other Ambulatory Visit: Payer: Self-pay | Admitting: Internal Medicine

## 2022-04-16 DIAGNOSIS — K219 Gastro-esophageal reflux disease without esophagitis: Secondary | ICD-10-CM

## 2022-04-16 MED ORDER — PANTOPRAZOLE SODIUM 20 MG PO TBEC: 20.0000 mg | DELAYED_RELEASE_TABLET | Freq: Every day | ORAL | 0 refills | Status: DC

## 2022-05-02 ENCOUNTER — Encounter: Payer: Self-pay | Admitting: Internal Medicine

## 2022-05-14 ENCOUNTER — Other Ambulatory Visit: Payer: Self-pay | Admitting: Internal Medicine

## 2022-05-14 DIAGNOSIS — F419 Anxiety disorder, unspecified: Secondary | ICD-10-CM

## 2022-05-14 MED ORDER — BUSPIRONE HCL 7.5 MG PO TABS: 7.5000 mg | ORAL_TABLET | Freq: Two times a day (BID) | ORAL | 1 refills | Status: DC | PRN

## 2022-05-21 ENCOUNTER — Other Ambulatory Visit: Payer: Self-pay | Admitting: Internal Medicine

## 2022-05-21 ENCOUNTER — Encounter: Payer: Self-pay | Admitting: Internal Medicine

## 2022-05-21 DIAGNOSIS — N529 Male erectile dysfunction, unspecified: Secondary | ICD-10-CM

## 2022-05-21 MED ORDER — TADALAFIL 10 MG PO TABS: 10.0000 mg | ORAL_TABLET | ORAL | 0 refills | Status: DC | PRN

## 2022-06-15 ENCOUNTER — Encounter: Payer: Self-pay | Admitting: Cardiology

## 2022-06-23 IMAGING — CR DG CHEST 2V
1 series · 2 of 2 positions shown · non-contrast
Comparison: 04/07/2020

CLINICAL DATA: Chest pain and palpitation

EXAM:
CHEST - 2 VIEW

[Series 1: dg chest 2 view · 0.14mm/px · 2 of 2 slices shown]
[im 1/2]
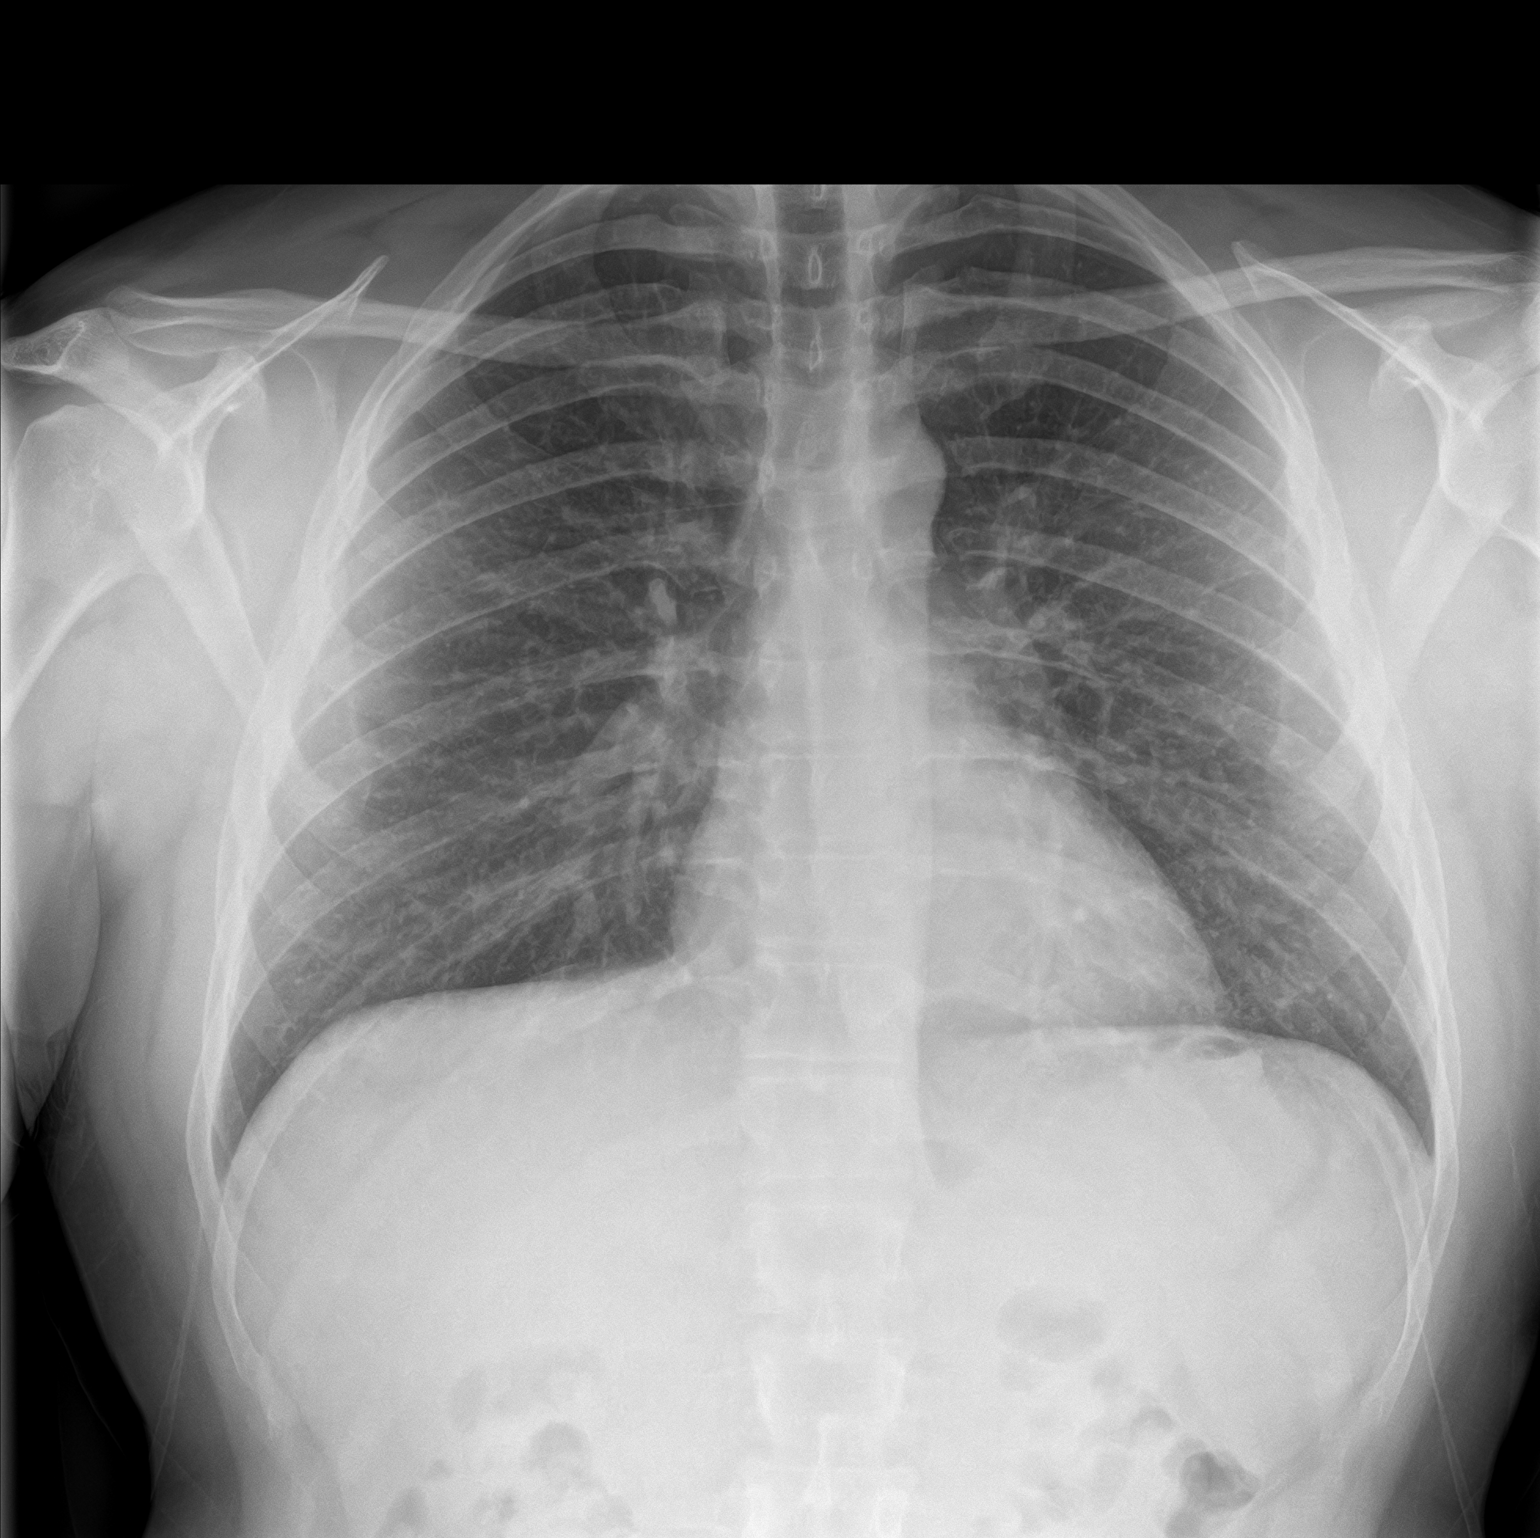
[im 2/2]
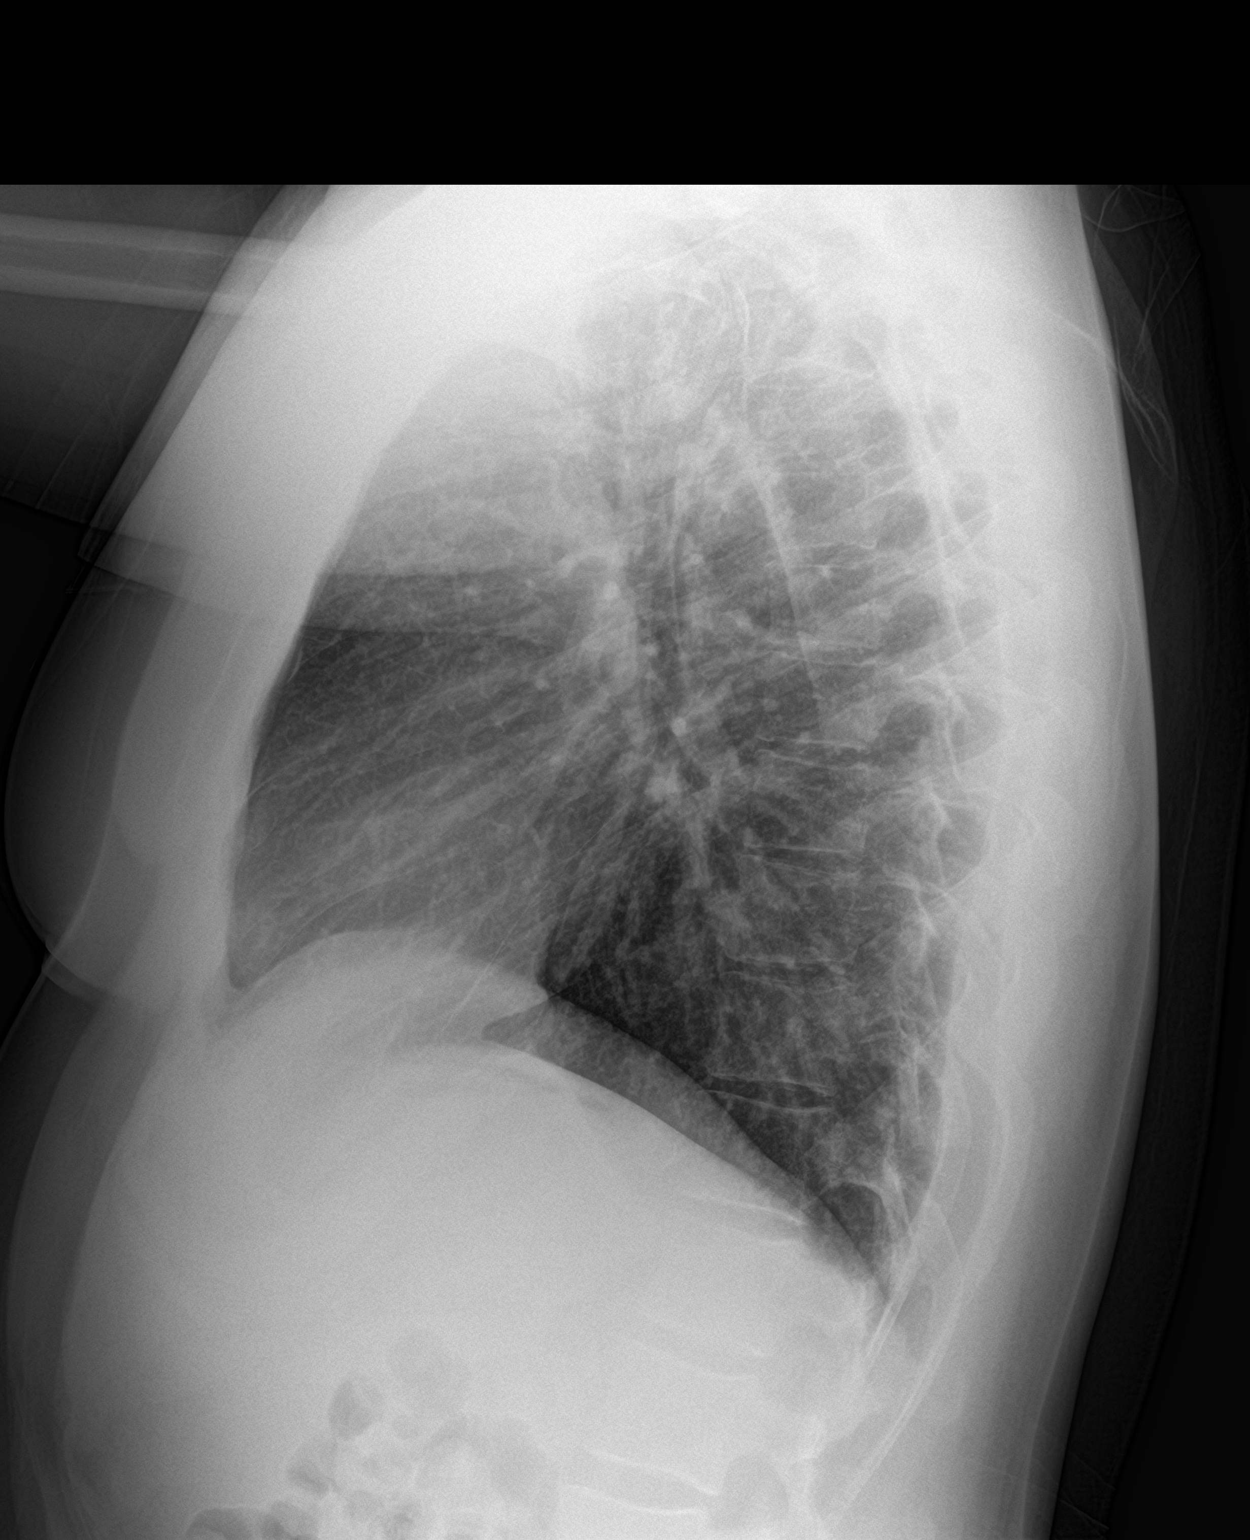

[2 of 2 positions shown; findings below may reference images not displayed]

FINDINGS: The heart size and mediastinal contours are within normal limits.
Both lungs are clear. The visualized skeletal structures are
unremarkable.
IMPRESSION: No active cardiopulmonary disease.

## 2022-07-02 ENCOUNTER — Other Ambulatory Visit: Payer: Self-pay | Admitting: Internal Medicine

## 2022-07-02 DIAGNOSIS — N529 Male erectile dysfunction, unspecified: Secondary | ICD-10-CM

## 2022-07-02 MED ORDER — TADALAFIL 5 MG PO TABS: 5.0000 mg | ORAL_TABLET | ORAL | 0 refills | Status: DC | PRN

## 2022-07-11 NOTE — Progress Notes (Unsigned)
Established Patient Office Visit  Subjective    Patient ID: Joseph Joseph, male    DOB: [birth year]  Age: 27 y.o. MRN: [id number]  CC:  No chief complaint on file.   HPI Joseph Joseph presents for follow up on chronic medical conditions.  Paroxysmal A.Fib: -Following with Cardiology, last seen on 11/13/21 -Underwent DC cardioversion in April 2023 when he was awoken from sleep with palpitations and tachycardia. His apple watch alerted to him being in A. Fib and he was diaphoretic and went to the ER where he was cardioverted.  -Echo 09/04/21 showing EF 55-60% -Currently on Metoprolol XL 50 mg, had been on Eliquis but no longer, EP discontinued it after his cardioversion  -Does occasionally get palpations like a skipped beat about only once every few weeks. Denies chest pain, shortness of breath or other A. Fib episodes   Anxiety: -Currently on Buspar 7.5 mg BID, controlled   GERD: -Currently on Pantoprazole 40 mg daily, tried to decrease to 20 mg daily but had break through symptoms -Had acid reflux for about 1 year, has epigastric burning pain when symptomatic, no nausea or vomiting. Appetite good, no changes in weight.   Health Maintenance: -Blood work UTD  Outpatient Encounter Medications as of 07/12/2022  Medication Sig   tadalafil (CIALIS) 5 MG tablet Take 1 tablet (5 mg total) by mouth as needed for erectile dysfunction.   busPIRone (BUSPAR) 7.5 MG tablet Take 1 tablet (7.5 mg total) by mouth every 12 (twelve) hours as needed (anxiety).   hydrOXYzine (ATARAX) 10 MG tablet TAKE 1 TABLET BY MOUTH EVERY 12 HOURS AS NEEDED FOR ANXIETY.   metoprolol succinate (TOPROL-XL) 50 MG 24 hr tablet Take 1 tablet (50 mg total) by mouth daily. Take with or immediately following a meal.   pantoprazole (PROTONIX) 20 MG tablet Take 1 tablet (20 mg total) by mouth daily.   No facility-administered encounter medications on file as of 07/12/2022.    Past Medical History:  Diagnosis Date    Anxiety    GERD (gastroesophageal reflux disease)    Hypertension    PAF (paroxysmal atrial fibrillation) ([place])    a. 07/2021 s/p DCCV; b. 08/2021 Zio: no recurrent Afib; c. CHA2DS2VASc = 0.    No past surgical history on file.  Family History  Problem Relation Age of Onset   Heart disease Mother    GER disease Mother     Social History   Socioeconomic History   Marital status: Single    Spouse name: Not on file   Number of children: Not on file   Years of education: Not on file   Highest education level: Not on file  Occupational History   Not on file  Tobacco Use   Smoking status: Former   Smokeless tobacco: Never  Vaping Use   Vaping Use: Never used  Substance and Sexual Activity   Alcohol use: Not Currently    Comment: 4-5 drinks per week   Drug use: Never   Sexual activity: Yes  Other Topics Concern   Not on file  Social History Narrative   Not on file   Social Determinants of Health   Financial Resource Strain: Not on file  Food Insecurity: Not on file  Transportation Needs: Not on file  Physical Activity: Not on file  Stress: Not on file  Social Connections: Not on file  Intimate Partner Violence: Not on file    Review of Systems  Constitutional:  Negative for chills and fever.  Respiratory:  Negative for shortness of breath.   Cardiovascular:  Positive for palpitations. Negative for chest pain.  Gastrointestinal:  Positive for heartburn. Negative for abdominal pain, blood in stool, constipation, diarrhea, nausea and vomiting.  Genitourinary:  Negative for hematuria.        Objective    There were no vitals taken for this visit.  Physical Exam Constitutional:      Appearance: Normal appearance.  HENT:     Head: Normocephalic and atraumatic.     Mouth/Throat:     Mouth: Mucous membranes are moist.     Pharynx: Oropharynx is clear.  Eyes:     Extraocular Movements: Extraocular movements intact.     Conjunctiva/sclera: Conjunctivae normal.      Pupils: Pupils are equal, round, and reactive to light.  Cardiovascular:     Rate and Rhythm: Normal rate and regular rhythm.     Heart sounds: No murmur heard. Pulmonary:     Effort: Pulmonary effort is normal.     Breath sounds: Normal breath sounds.  Musculoskeletal:     Right lower leg: No edema.     Left lower leg: No edema.  Skin:    General: Skin is warm and dry.  Neurological:     General: No focal deficit present.     Mental Status: He is alert. Mental status is at baseline.  Psychiatric:        Mood and Affect: Mood normal.        Behavior: Behavior normal.         Assessment & Plan:   1. Paroxysmal atrial fibrillation [facility]): S/p cardioversion in April. Off anticoagulation, continue Metoprolol XL 50 mg daily.   - metoprolol succinate (TOPROL-XL) 50 MG 24 hr tablet; Take 1 tablet (50 mg total) by mouth daily. Take with or immediately following a meal.  Dispense: 90 tablet; Refill: 1  2. Anxiety: Controlled, continue Buspar 7.5 mg BID, refilled today.   - busPIRone (BUSPAR) 7.5 MG tablet; Take 1 tablet (7.5 mg total) by mouth 2 (two) times daily.  Dispense: 60 tablet; Refill: 5  3. Gastroesophageal reflux disease, unspecified whether esophagitis present: Continue Protonix 40 mg daily for another 3 months, then patient will reduce dose to 20 mg daily. If having break through symptoms, will refer to GI for EGD.   - pantoprazole (PROTONIX) 40 MG tablet; Take 1 tablet (40 mg total) by mouth daily.  Dispense: 90 tablet; Refill: 1  No follow-ups on file.   Teodora Medici, DO

## 2022-07-12 ENCOUNTER — Encounter: Payer: Self-pay | Admitting: Internal Medicine

## 2022-07-12 ENCOUNTER — Ambulatory Visit: Payer: PRIVATE HEALTH INSURANCE | Admitting: Internal Medicine

## 2022-07-12 ENCOUNTER — Other Ambulatory Visit: Payer: Self-pay | Admitting: Internal Medicine

## 2022-07-12 VITALS — BP 114/66 | HR 72 | Temp 98.3°F | Resp 18 | Ht 68.0 in | Wt 216.2 lb

## 2022-07-12 DIAGNOSIS — D1801 Hemangioma of skin and subcutaneous tissue: Secondary | ICD-10-CM

## 2022-07-12 DIAGNOSIS — I480 Paroxysmal atrial fibrillation: Secondary | ICD-10-CM

## 2022-07-12 DIAGNOSIS — K219 Gastro-esophageal reflux disease without esophagitis: Secondary | ICD-10-CM | POA: Diagnosis not present

## 2022-07-12 DIAGNOSIS — J342 Deviated nasal septum: Secondary | ICD-10-CM

## 2022-07-12 DIAGNOSIS — F419 Anxiety disorder, unspecified: Secondary | ICD-10-CM

## 2022-07-12 NOTE — Patient Instructions (Addendum)
It was great seeing you today!  Plan discussed at today's visit: -EKG today looks good -Referral to Dermatology and ENT placed today -Please call Cardiology at [phone] to schedule an appointment -Consider Tdap vaccine -Recommend Claritin, Allegra or Zyrtec over the counter for allergies   Follow up in: 6 months   Take care and let us know if you have any questions or concerns prior to your next visit.  Dr. Rosana Berger

## 2022-07-13 NOTE — Telephone Encounter (Signed)
Requested Prescriptions  Pending Prescriptions Disp Refills   pantoprazole (PROTONIX) 20 MG tablet [Pharmacy Med Name: PANTOPRAZOLE SOD DR 20 MG TAB] 90 tablet 0    Sig: TAKE 1 TABLET BY MOUTH EVERY DAY     Gastroenterology: Proton Pump Inhibitors Passed - 07/12/2022  1:54 AM      Passed - Valid encounter within last 12 months    Recent Outpatient Visits           Yesterday Paroxysmal atrial fibrillation [facility]   [facility] Teodora Medici, DO   6 months ago Paroxysmal atrial fibrillation [facility])   [facility] Teodora Medici, DO       Future Appointments             In 1 month Agbor-Etang, Aaron Edelman, MD [place] [facility] [place]   In 6 months Teodora Medici, [facility], [facility]

## 2022-08-12 ENCOUNTER — Encounter: Payer: Self-pay | Admitting: Internal Medicine

## 2022-08-17 ENCOUNTER — Encounter: Payer: Self-pay | Admitting: Cardiology

## 2022-08-17 ENCOUNTER — Ambulatory Visit: Payer: PRIVATE HEALTH INSURANCE | Attending: Cardiology | Admitting: Cardiology

## 2022-08-17 ENCOUNTER — Ambulatory Visit (INDEPENDENT_AMBULATORY_CARE_PROVIDER_SITE_OTHER): Payer: PRIVATE HEALTH INSURANCE

## 2022-08-17 VITALS — BP 108/60 | HR 76 | Ht 68.0 in | Wt 214.4 lb

## 2022-08-17 DIAGNOSIS — K219 Gastro-esophageal reflux disease without esophagitis: Secondary | ICD-10-CM | POA: Diagnosis not present

## 2022-08-17 DIAGNOSIS — I480 Paroxysmal atrial fibrillation: Secondary | ICD-10-CM | POA: Diagnosis not present

## 2022-08-17 NOTE — Progress Notes (Signed)
Cardiology Office Note:    Date:  08/17/2022   ID:  Joseph Joseph, DOB 04/10/1996, MRN [id number]  PCP:  Margarita Mail, DO   [facility] Providers Cardiologist:  Debbe Odea, MD Electrophysiologist:  Lanier Prude, MD     Referring MD: Margarita Mail, DO   Chief Complaint  Patient presents with   Follow-up    6 month f/u c/o occasional PVC's. Meds reviewed verbally with pt.    History of Present Illness:    Joseph Joseph is a 27 y.o. male with a hx of paroxysmal atrial fibrillation s/p DCCV 08/05/2021, anxiety, GERD, former smoker x7 years who presents for follow up.   Has been doing well until the past several weeks where he has noticed palpitations, requiring at least 5 days weekly.  Symptoms lasted a couple of hours.  Denies dizziness or syncope.  Not sure if this is secondary to anxiety.  Also states having a prominent heartbeat after working out. This occurred once.  Prior notes Echo 08/2021 EF 55 to 60% No evidence of sleep apnea Cardiac monitor x 2 weeks after cardioversion showed no A-fib recurrence   Past Medical History:  Diagnosis Date   Anxiety    GERD (gastroesophageal reflux disease)    Hypertension    PAF (paroxysmal atrial fibrillation)    a. 07/2021 s/p DCCV; b. 08/2021 Zio: no recurrent Afib; c. CHA2DS2VASc = 0.    History reviewed. No pertinent surgical history.  Current Medications: Current Meds  Medication Sig   busPIRone (BUSPAR) 7.5 MG tablet Take 1 tablet (7.5 mg total) by mouth every 12 (twelve) hours as needed (anxiety).   metoprolol succinate (TOPROL-XL) 50 MG 24 hr tablet Take 1 tablet (50 mg total) by mouth daily. Take with or immediately following a meal.   tadalafil (CIALIS) 5 MG tablet Take 1 tablet (5 mg total) by mouth as needed for erectile dysfunction.     Allergies:   Rocephin [ceftriaxone]   Social History   Socioeconomic History   Marital status: Single    Spouse name: Not on file   Number of  children: Not on file   Years of education: Not on file   Highest education level: Not on file  Occupational History   Not on file  Tobacco Use   Smoking status: Former   Smokeless tobacco: Never  Vaping Use   Vaping Use: Never used  Substance and Sexual Activity   Alcohol use: Not Currently    Comment: 4-5 drinks per week   Drug use: Never   Sexual activity: Yes  Other Topics Concern   Not on file  Social History Narrative   Not on file   Social Determinants of Health   Financial Resource Strain: Not on file  Food Insecurity: Not on file  Transportation Needs: Not on file  Physical Activity: Not on file  Stress: Not on file  Social Connections: Not on file     Family History: The patient's family history includes GER disease in his mother; Heart disease in his mother.  ROS:   Please see the history of present illness.     All other systems reviewed and are negative.  EKGs/Labs/Other Studies Reviewed:    The following studies were reviewed today:   EKG:  EKG is  ordered today.  The ekg ordered today demonstrates sinus rhythm, heart rate 76  Recent Labs: 09/18/2021: BUN 19; Creatinine, Ser 0.73; Hemoglobin 14.7; Platelets 268; Potassium 4.2; Sodium 137  Recent Lipid Panel  Component Value Date/Time   CHOL 158 11/01/2018 1159   TRIG 52.0 11/01/2018 1159   HDL 50.70 11/01/2018 1159   CHOLHDL 3 11/01/2018 1159   VLDL 10.4 11/01/2018 1159   LDLCALC 97 11/01/2018 1159     Risk Assessment/Calculations:    STOP-Bang Score:  3       Physical Exam:    VS:  BP 108/60 (BP Location: Left Arm, Patient Position: Sitting, Cuff Size: Normal)   Pulse 76   Ht 5\' 8"  (1.727 m)   Wt 214 lb 6 oz (97.2 kg)   SpO2 98%   BMI 32.60 kg/m     Wt Readings from Last 3 Encounters:  08/17/22 214 lb 6 oz (97.2 kg)  07/12/22 216 lb 3.2 oz (98.1 kg)  01/11/22 200 lb 9.6 oz (91 kg)     GEN:  Well nourished, well developed in no acute distress HEENT: Normal NECK: No JVD; No  carotid bruits CARDIAC: RRR, no murmurs, rubs, gallops RESPIRATORY:  Clear to auscultation without rales, wheezing or rhonchi  ABDOMEN: Soft, non-tender, non-distended MUSCULOSKELETAL:  No edema; No deformity  SKIN: Warm and dry NEUROLOGIC:  Alert and oriented x 3 PSYCHIATRIC:  Normal affect   ASSESSMENT:    1. Paroxysmal atrial fibrillation   2. Gastroesophageal reflux disease without esophagitis    PLAN:    In order of problems listed above:  Symptomatic, Parox A-fib s/p DC cardioversion 07/2021.  Normal EF 55-60%, normal LA size.  Previous cardiac monitor x2 weeks after cardioversion with no evidence for atrial fibrillation.  CHADS2 Vas score 0.  Continue Toprol-XL.  Occasional palpitations over the last several weeks Place cardiac monitor x 2 weeks to evaluate any A-fib recurrence. anxiety could be contributing. History of GERD, OTC Prilosec okay.  Follow-up in 12 months or earlier if significant arrhythmias noted on monitor.     Medication Adjustments/Labs and Tests Ordered: Current medicines are reviewed at length with the patient today.  Concerns regarding medicines are outlined above.  Orders Placed This Encounter  Procedures   LONG TERM MONITOR (3-14 DAYS)   EKG 12-Lead   No orders of the defined types were placed in this encounter.   Patient Instructions  Medication Instructions:   Your physician recommends that you continue on your current medications as directed. Please refer to the Current Medication list given to you today.  *If you need a refill on your cardiac medications before your next appointment, please call your pharmacy*   Lab Work:  None Ordered  If you have labs (blood work) drawn today and your tests are completely normal, you will receive your results only by: MyChart Message (if you have MyChart) OR A paper copy in the mail If you have any lab test that is abnormal or we need to change your treatment, we will call you to review the  results.   Testing/Procedures:  Your physician has recommended that you wear a Zio monitor.   This monitor is a medical device that records the heart's electrical activity. Doctors most often use these monitors to diagnose arrhythmias. Arrhythmias are problems with the speed or rhythm of the heartbeat. The monitor is a small device applied to your chest. You can wear one while you do your normal daily activities. While wearing this monitor if you have any symptoms to push the button and record what you felt. Once you have worn this monitor for the period of time provider prescribed (Usually 14 days), you will return the monitor device in  the postage paid box. Once it is returned they will download the data collected and provide [place] with a report which the provider will then review and we will call you with those results. Important tips:  Avoid showering during the first 24 hours of wearing the monitor. Avoid excessive sweating to help maximize wear time. Do not submerge the device, no hot tubs, and no swimming pools. Keep any lotions or oils away from the patch. After 24 hours you may shower with the patch on. Take brief showers with your back facing the shower head.  Do not remove patch once it has been placed because that will interrupt data and decrease adhesive wear time. Push the button when you have any symptoms and write down what you were feeling. Once you have completed wearing your monitor, remove and place into box which has postage paid and place in your outgoing mailbox.  If for some reason you have misplaced your box then call our office and we can provide another box and/or mail it off for you.      Follow-Up: At [facility], you and your health needs are our priority.  As part of our continuing mission to provide you with exceptional heart care, we have created designated Provider Care Teams.  These Care Teams include your primary Cardiologist (physician) and Advanced  Practice Providers (APPs -  Physician Assistants and Nurse Practitioners) who all work together to provide you with the care you need, when you need it.  We recommend signing up for the patient portal called "MyChart".  Sign up information is provided on this After Visit Summary.  MyChart is used to connect with patients for Virtual Visits (Telemedicine).  Patients are able to view lab/test results, encounter notes, upcoming appointments, etc.  Non-urgent messages can be sent to your provider as well.   To learn more about what you can do with MyChart, go to [email or web address].    Your next appointment:   1 year(s)  Provider:   You may see Debbe Odea, MD or one of the following Advanced Practice Providers on your designated Care Team:   Nicolasa Ducking, NP Eula Listen, PA-C Cadence Fransico Michael, PA-C Charlsie Quest, NP   Signed, Debbe Odea, MD  08/17/2022 10:16 AM    [facility]

## 2022-08-17 NOTE — Patient Instructions (Signed)
Medication Instructions:   Your physician recommends that you continue on your current medications as directed. Please refer to the Current Medication list given to you today.  *If you need a refill on your cardiac medications before your next appointment, please call your pharmacy*   Lab Work:  None Ordered  If you have labs (blood work) drawn today and your tests are completely normal, you will receive your results only by: MyChart Message (if you have MyChart) OR A paper copy in the mail If you have any lab test that is abnormal or we need to change your treatment, we will call you to review the results.   Testing/Procedures:  Your physician has recommended that you wear a Zio monitor.   This monitor is a medical device that records the heart's electrical activity. Doctors most often use these monitors to diagnose arrhythmias. Arrhythmias are problems with the speed or rhythm of the heartbeat. The monitor is a small device applied to your chest. You can wear one while you do your normal daily activities. While wearing this monitor if you have any symptoms to push the button and record what you felt. Once you have worn this monitor for the period of time provider prescribed (Usually 14 days), you will return the monitor device in the postage paid box. Once it is returned they will download the data collected and provide [place] with a report which the provider will then review and we will call you with those results. Important tips:  Avoid showering during the first 24 hours of wearing the monitor. Avoid excessive sweating to help maximize wear time. Do not submerge the device, no hot tubs, and no swimming pools. Keep any lotions or oils away from the patch. After 24 hours you may shower with the patch on. Take brief showers with your back facing the shower head.  Do not remove patch once it has been placed because that will interrupt data and decrease adhesive wear time. Push the button  when you have any symptoms and write down what you were feeling. Once you have completed wearing your monitor, remove and place into box which has postage paid and place in your outgoing mailbox.  If for some reason you have misplaced your box then call our office and we can provide another box and/or mail it off for you.      Follow-Up: At [facility], you and your health needs are our priority.  As part of our continuing mission to provide you with exceptional heart care, we have created designated Provider Care Teams.  These Care Teams include your primary Cardiologist (physician) and Advanced Practice Providers (APPs -  Physician Assistants and Nurse Practitioners) who all work together to provide you with the care you need, when you need it.  We recommend signing up for the patient portal called "MyChart".  Sign up information is provided on this After Visit Summary.  MyChart is used to connect with patients for Virtual Visits (Telemedicine).  Patients are able to view lab/test results, encounter notes, upcoming appointments, etc.  Non-urgent messages can be sent to your provider as well.   To learn more about what you can do with MyChart, go to [email or web address].    Your next appointment:   1 year(s)  Provider:   You may see Debbe Odea, MD or one of the following Advanced Practice Providers on your designated Care Team:   Nicolasa Ducking, NP Eula Listen, PA-C Cadence Fransico Michael, PA-C Charlsie Quest, NP

## 2022-08-22 DIAGNOSIS — I480 Paroxysmal atrial fibrillation: Secondary | ICD-10-CM

## 2022-08-24 ENCOUNTER — Encounter: Payer: Self-pay | Admitting: Physician Assistant

## 2022-08-24 ENCOUNTER — Encounter: Payer: Self-pay | Admitting: Internal Medicine

## 2022-08-24 ENCOUNTER — Ambulatory Visit (INDEPENDENT_AMBULATORY_CARE_PROVIDER_SITE_OTHER): Payer: PRIVATE HEALTH INSURANCE | Admitting: Physician Assistant

## 2022-08-24 VITALS — BP 104/62 | HR 79 | Temp 97.9°F | Resp 16 | Ht 68.0 in | Wt 215.8 lb

## 2022-08-24 DIAGNOSIS — K625 Hemorrhage of anus and rectum: Secondary | ICD-10-CM

## 2022-08-24 NOTE — Progress Notes (Signed)
Acute Office Visit   Patient: Joseph Joseph   DOB: [birth year]   27 y.o. Male  MRN: [id number] Visit Date: 08/24/2022  Today's healthcare provider: Oswaldo Conroy Kesley Gaffey, PA-C  Introduced myself to the patient as a Secondary school teacher and provided education on APPs in clinical practice.    Chief Complaint  Patient presents with   Rectal Bleeding    This am a lot of blood while he wiped and in the toilet after doing his bowel movement. He denies pain or any related GI sx    Subjective    HPI HPI     Rectal Bleeding    Additional comments: This am a lot of blood while he wiped and in the toilet after doing his bowel movement. He denies pain or any related GI sx       Last edited by Forde Radon, CMA on 08/24/2022 11:12 AM.       Rectal bleeding   Onset:sudden Duration: occurred this AM Reports having a normal bowel movement this AM and found a lot of blood on toilet paper after wiping He denies pain with bowel movement this AM He reports bright red blood on toilet paper and in toilet   He reports feeling as though he had an upset stomach over the past few days but denies abdominal pain, nausea, vomiting, diarrhea, constipation  He reports some light-headedness this AM when event occurred but this has resolved  He denies ever having anything like this before and does not think he has ever had a rectal exam previously  He reports family history of GI issues- mostly related to bowel habits, denies family hx of colon cancer  He reports increased stress lately  He denies increased use of NSAIDs or aspirin recently   Medications: Outpatient Medications Prior to Visit  Medication Sig   busPIRone (BUSPAR) 7.5 MG tablet Take 1 tablet (7.5 mg total) by mouth every 12 (twelve) hours as needed (anxiety).   metoprolol succinate (TOPROL-XL) 50 MG 24 hr tablet Take 1 tablet (50 mg total) by mouth daily. Take with or immediately following a meal.   tadalafil (CIALIS) 5 MG tablet  Take 1 tablet (5 mg total) by mouth as needed for erectile dysfunction.   pantoprazole (PROTONIX) 20 MG tablet TAKE 1 TABLET BY MOUTH EVERY DAY (Patient not taking: Reported on 08/24/2022)   No facility-administered medications prior to visit.    Review of Systems  Constitutional:  Negative for chills and fever.  Gastrointestinal:  Positive for anal bleeding and blood in stool. Negative for abdominal distention, abdominal pain, constipation, diarrhea, nausea, rectal pain and vomiting.  Neurological:  Positive for light-headedness.       Objective    BP 104/62   Pulse 79   Temp 97.9 F (36.6 C) (Oral)   Resp 16   Ht 5\' 8"  (1.727 m)   Wt 215 lb 12.8 oz (97.9 kg)   SpO2 97%   BMI 32.81 kg/m    Physical Exam Vitals reviewed. Exam conducted with a chaperone present.  Constitutional:      General: He is awake.     Appearance: Normal appearance. He is well-developed and well-groomed.  HENT:     Head: Normocephalic and atraumatic.  Eyes:     Extraocular Movements: Extraocular movements intact.     Conjunctiva/sclera: Conjunctivae normal.     Pupils: Pupils are equal, round, and reactive to light.  Pulmonary:     Effort: Pulmonary effort is  normal.  Abdominal:     General: Abdomen is flat. Bowel sounds are normal.     Palpations: Abdomen is soft.     Tenderness: There is no abdominal tenderness.  Genitourinary:    Rectum: No mass, anal fissure, external hemorrhoid or internal hemorrhoid. Normal anal tone.     Comments: Rectal area was examined today for potential hemorrhoids, rectal fissure, masses.  Mild amount of dried blood was noted along surfaces of natal cleft bilaterally.  No external hemorrhoids were noted on visual inspection.  No internal hemorrhoids were palpated with digital rectal exam Patient did not note tenderness or pain with DRE Musculoskeletal:     Cervical back: Normal range of motion.  Skin:    General: Skin is warm and dry.  Neurological:     General:  No focal deficit present.     Mental Status: He is alert and oriented to person, place, and time.  Psychiatric:        Attention and Perception: Attention and perception normal.        Mood and Affect: Mood and affect normal.        Speech: Speech normal.        Behavior: Behavior normal. Behavior is cooperative.        Thought Content: Thought content normal.        Judgment: Judgment normal.       No results found for any visits on 08/24/22.  Assessment & Plan      No follow-ups on file.       Problem List Items Addressed This Visit   None Visit Diagnoses     Bright red rectal bleeding    -  Primary Acute, new concern Patient reports significant amount of bright red blood per rectum this morning after bowel movement He denies ever having this happen before and denies previous rectal exam Physical exam was overall benign without concerning findings Will check CBC, CMP, PT, PTT to assist with rule out. Given his HPI and symptoms I suspect he has internal hemorrhoid that is post-thrombus and thus no longer enlarged. I cannot rule out diverticulitis, colon polyp, bleeding disorder at this time but lab results will further assist with this  Lab results to further dictate management  If needed will place referral to GI specialty for further evaluation and potential management For now recommend that he treats this with conservative management of hemorrhoids-increasing fiber intake, staying well-hydrated, exercise, reducing stress Reviewed return and emergent precautions Follow-up as needed for persistent or progressing symptoms   Relevant Orders   CBC w/Diff/Platelet   COMPLETE METABOLIC PANEL WITH GFR   Protime-INR   PTT        No follow-ups on file.   I, Phoebie Shad E Laquia Rosano, PA-C, have reviewed all documentation for this visit. The documentation on 08/24/22 for the exam, diagnosis, procedures, and orders are all accurate and complete.   Jacquelin Hawking, MHS, PA-C [facility]

## 2022-08-24 NOTE — Patient Instructions (Signed)
  Slowly increase your fiber intake (5 grams per day per week for the first week, then increase by 5 grams each subsequent week)  Make sure you are staying well hydrated and exercising as this will help manage hemorrhoid symptoms  If you notice persistent bleeding, lightheadedness, dizziness, paleness, fever, chills, please let us know.  If your symptoms continue we will likely need to refer you to GI services to assess what may be causing the bleeding.

## 2022-08-25 LAB — CBC WITH DIFFERENTIAL/PLATELET
Absolute Monocytes: 525 cells/uL (ref 200–950)
Basophils Absolute: 38 cells/uL (ref 0–200)
Basophils Relative: 0.5 %
Eosinophils Absolute: 128 cells/uL (ref 15–500)
Eosinophils Relative: 1.7 %
HCT: 42.8 % (ref 38.5–50.0)
Hemoglobin: 14.2 g/dL (ref 13.2–17.1)
Lymphs Abs: 1733 cells/uL (ref 850–3900)
MCH: 31 pg (ref 27.0–33.0)
MCHC: 33.2 g/dL (ref 32.0–36.0)
MCV: 93.4 fL (ref 80.0–100.0)
MPV: 10.1 fL (ref 7.5–12.5)
Monocytes Relative: 7 %
Neutro Abs: 5078 cells/uL (ref 1500–7800)
Neutrophils Relative %: 67.7 %
Platelets: 242 10*3/uL (ref 140–400)
RBC: 4.58 10*6/uL (ref 4.20–5.80)
RDW: 12.1 % (ref 11.0–15.0)
Total Lymphocyte: 23.1 %
WBC: 7.5 10*3/uL (ref 3.8–10.8)

## 2022-08-25 LAB — COMPLETE METABOLIC PANEL WITH GFR
AG Ratio: 1.6 (calc) (ref 1.0–2.5)
ALT: 16 U/L (ref 9–46)
AST: 17 U/L (ref 10–40)
Albumin: 4.5 g/dL (ref 3.6–5.1)
Alkaline phosphatase (APISO): 50 U/L (ref 36–130)
BUN: 9 mg/dL (ref 7–25)
CO2: 28 mmol/L (ref 20–32)
Calcium: 9.6 mg/dL (ref 8.6–10.3)
Chloride: 104 mmol/L (ref 98–110)
Creat: 0.84 mg/dL (ref 0.60–1.24)
Globulin: 2.8 g/dL (calc) (ref 1.9–3.7)
Glucose, Bld: 85 mg/dL (ref 65–99)
Potassium: 4.7 mmol/L (ref 3.5–5.3)
Sodium: 140 mmol/L (ref 135–146)
Total Bilirubin: 0.6 mg/dL (ref 0.2–1.2)
Total Protein: 7.3 g/dL (ref 6.1–8.1)
eGFR: 121 mL/min/{1.73_m2} (ref >=60)

## 2022-08-25 LAB — APTT: aPTT: 31 s (ref 23–32)

## 2022-08-25 NOTE — Progress Notes (Signed)
Your labs were normal Your CBC did not show signs of anemia or acute blood loss Your electrolytes, liver and kidney function were within normal limits We are still waiting on one of your tests but the one that has come back does not show signs of clotting issues Please let us know if you have further concerns or if the bleeding continues

## 2022-08-28 ENCOUNTER — Other Ambulatory Visit: Payer: Self-pay | Admitting: Internal Medicine

## 2022-08-28 DIAGNOSIS — I480 Paroxysmal atrial fibrillation: Secondary | ICD-10-CM

## 2022-08-30 NOTE — Telephone Encounter (Signed)
Requested Prescriptions  Pending Prescriptions Disp Refills   metoprolol succinate (TOPROL-XL) 50 MG 24 hr tablet [Pharmacy Med Name: METOPROLOL SUCC ER 50 MG TAB] 90 tablet 0    Sig: TAKE 1 TABLET BY MOUTH DAILY. TAKE WITH OR IMMEDIATELY FOLLOWING A MEAL.     Cardiovascular:  Beta Blockers Passed - 08/28/2022  1:12 AM      Passed - Last BP in normal range    BP Readings from Last 1 Encounters:  08/24/22 104/62         Passed - Last Heart Rate in normal range    Pulse Readings from Last 1 Encounters:  08/24/22 79         Passed - Valid encounter within last 6 months    Recent Outpatient Visits           6 days ago Bright red rectal bleeding   [place] [facility] Mecum, Erin E, PA-C   1 month ago Paroxysmal atrial fibrillation [facility])   [facility] Margarita Mail, DO   7 months ago Paroxysmal atrial fibrillation [facility])   [facility] Margarita Mail, DO       Future Appointments             In 4 months Margarita Mail, DO [facility], [facility]

## 2022-09-14 ENCOUNTER — Encounter: Payer: Self-pay | Admitting: Internal Medicine

## 2022-09-16 NOTE — Progress Notes (Signed)
Acute Office Visit  Subjective:     Patient ID: Joseph Joseph, male    DOB: 25-Jun-1995, 27 y.o.   MRN: [id number]  Chief Complaint  Patient presents with   Anxiety    Abdominal pain and headache assosiated    HPI Patient is in today for headache and abdominal pain. Patient states a few weeks ago he had a BM with bright red blood. Blood was seen in the toilet bowl and with wiping. He only had this once. He denies rectal pain. He does have occasional sharp LLQ pain, will last a few seconds, non-radiating. Sometimes associated with eating. Denies fevers, nausea, vomiting, diarrhea or constipation.  Labs done on 08/14/2022 negative.   Also is having a pressure like sensation in the back of his head and neck with occasional sharp head pains.   Anxiety seems to be worse lately, does take Buspar 7.5 mg BID.      09/17/2022   10:23 AM 08/24/2022   10:58 AM 07/12/2022    8:59 AM 01/11/2022    9:20 AM 11/01/2018   11:42 AM  Depression screen PHQ 2/9  Decreased Interest 2 0 2 0 0  Down, Depressed, Hopeless 3 0 2 2 0  PHQ - 2 Score 5 0 4 2 0  Altered sleeping 1 0 1 0 0  Tired, decreased energy 2 0 1 1 0  Change in appetite 1 0 2 1 0  Feeling bad or failure about yourself  2 0 1 0 0  Trouble concentrating 1 0 0 0 0  Moving slowly or fidgety/restless 2 0 0 0 0  Suicidal thoughts  0 0 0 0  PHQ-9 Score 14 0 9 4 0  Difficult doing work/chores Not difficult at all Not difficult at all Somewhat difficult Not difficult at all Not difficult at all    Review of Systems  Constitutional:  Negative for chills and fever.  Respiratory:  Negative for shortness of breath.   Cardiovascular:  Negative for chest pain.  Gastrointestinal:  Positive for abdominal pain and blood in stool. Negative for constipation, diarrhea, heartburn, melena, nausea and vomiting.  Neurological:  Positive for headaches.        Objective:    BP 122/64   Pulse 82   Temp 97.7 F (36.5 C)   Resp 16   Ht 5\' 8"  (1.727  m)   Wt 212 lb 12.8 oz (96.5 kg)   SpO2 96%   BMI 32.36 kg/m  BP Readings from Last 3 Encounters:  09/17/22 122/64  08/24/22 104/62  08/17/22 108/60   Wt Readings from Last 3 Encounters:  09/17/22 212 lb 12.8 oz (96.5 kg)  08/24/22 215 lb 12.8 oz (97.9 kg)  08/17/22 214 lb 6 oz (97.2 kg)      Physical Exam Constitutional:      Appearance: Normal appearance.  HENT:     Head: Normocephalic and atraumatic.  Eyes:     Conjunctiva/sclera: Conjunctivae normal.  Cardiovascular:     Rate and Rhythm: Normal rate and regular rhythm.  Pulmonary:     Effort: Pulmonary effort is normal.     Breath sounds: Normal breath sounds.  Abdominal:     General: Bowel sounds are normal. There is no distension.     Palpations: Abdomen is soft.     Tenderness: There is no abdominal tenderness. There is no right CVA tenderness, left CVA tenderness, guarding or rebound.  Skin:    General: Skin is warm and dry.  Neurological:  General: No focal deficit present.     Mental Status: He is alert. Mental status is at baseline.  Psychiatric:        Mood and Affect: Mood normal.        Behavior: Behavior normal.     No results found for any visits on 09/17/22.      Assessment & Plan:   1. Anxiety: Anxiety exacerbated, has never been on daily treatment. Will start Lexapro 5 mg and follow up here in 6 weeks to recheck. Continue Buspar.   - escitalopram (LEXAPRO) 5 MG tablet; Take 1 tablet (5 mg total) by mouth daily.  Dispense: 30 tablet; Refill: 1  2. Bright red rectal bleeding: Exam at last office visit negative for acute findings, labs normal. Has not seen any more blood, will send home with fecal occult card to return to the office. Abdominal exam benign, if fecal occult positive or if pain continues or increases in severity, will obtain at CT and potential GI referral.    Return in about 6 weeks (around 10/29/2022).  Margarita Mail, DO

## 2022-09-17 ENCOUNTER — Ambulatory Visit: Payer: PRIVATE HEALTH INSURANCE | Admitting: Internal Medicine

## 2022-09-17 VITALS — BP 122/64 | HR 82 | Temp 97.7°F | Resp 16 | Ht 68.0 in | Wt 212.8 lb

## 2022-09-17 DIAGNOSIS — K625 Hemorrhage of anus and rectum: Secondary | ICD-10-CM

## 2022-09-17 DIAGNOSIS — F419 Anxiety disorder, unspecified: Secondary | ICD-10-CM

## 2022-09-17 MED ORDER — ESCITALOPRAM OXALATE 5 MG PO TABS: 5.0000 mg | ORAL_TABLET | Freq: Every day | ORAL | 1 refills | Status: DC

## 2022-09-20 ENCOUNTER — Encounter: Payer: Self-pay | Admitting: Internal Medicine

## 2022-09-21 ENCOUNTER — Ambulatory Visit (INDEPENDENT_AMBULATORY_CARE_PROVIDER_SITE_OTHER): Payer: PRIVATE HEALTH INSURANCE

## 2022-09-21 DIAGNOSIS — K625 Hemorrhage of anus and rectum: Secondary | ICD-10-CM | POA: Diagnosis not present

## 2022-09-21 LAB — POC HEMOCCULT BLD/STL (HOME/3-CARD/SCREEN)
Card #1 Date: 5252024
Card #2 Date: 5262024
Card #2 Fecal Occult Blod, POC: NEGATIVE
Card #3 Date: 5272024
Card #3 Fecal Occult Blood, POC: NEGATIVE
Fecal Occult Blood, POC: NEGATIVE

## 2022-09-23 ENCOUNTER — Encounter: Payer: Self-pay | Admitting: Internal Medicine

## 2022-10-09 ENCOUNTER — Other Ambulatory Visit: Payer: Self-pay | Admitting: Internal Medicine

## 2022-10-09 DIAGNOSIS — F419 Anxiety disorder, unspecified: Secondary | ICD-10-CM

## 2022-10-11 NOTE — Telephone Encounter (Signed)
Requested medication (s) are due for refill today: no  Requested medication (s) are on the active medication list: yes  Last refill:  09/17/22  Future visit scheduled: yes  Notes to clinic:  Pharmacy comment: REQUEST FOR 90 DAYS PRESCRIPTION. DX Code Needed.      Requested Prescriptions  Pending Prescriptions Disp Refills   escitalopram (LEXAPRO) 5 MG tablet [Pharmacy Med Name: ESCITALOPRAM 5 MG TABLET] 90 tablet 1    Sig: Take 1 tablet (5 mg total) by mouth daily.     Psychiatry:  Antidepressants - SSRI Passed - 10/09/2022 11:30 AM      Passed - Valid encounter within last 6 months    Recent Outpatient Visits           3 weeks ago Anxiety   [facility] Margarita Mail, DO   1 month ago Bright red rectal bleeding   [facility] M[facility]um, Oswaldo Conroy, PA-C   3 months ago Paroxysmal atrial fibrillation [facility])   [facility] Margarita Mail, DO   9 months ago Paroxysmal atrial fibrillation [facility])   [facility] Margarita Mail, DO       Future Appointments             In 2 weeks Margarita Mail, DO [facility], [facility]   In 3 months Margarita Mail, DO [facility], [facility]

## 2022-10-12 ENCOUNTER — Other Ambulatory Visit: Payer: Self-pay | Admitting: Internal Medicine

## 2022-10-12 DIAGNOSIS — N529 Male erectile dysfunction, unspecified: Secondary | ICD-10-CM

## 2022-10-12 NOTE — Telephone Encounter (Signed)
Requested Prescriptions  Pending Prescriptions Disp Refills   tadalafil (CIALIS) 5 MG tablet [Pharmacy Med Name: TADALAFIL 5 MG TABLET] 90 tablet 0    Sig: TAKE 1 TABLET (5 MG TOTAL) BY MOUTH AS NEEDED FOR ERECTILE DYSFUNCTION.     Urology: Erectile Dysfunction Agents Passed - 10/12/2022  2:25 AM      Passed - AST in normal range and within 360 days    AST  Date Value Ref Range Status  08/24/2022 17 10 - 40 U/L Final         Passed - ALT in normal range and within 360 days    ALT  Date Value Ref Range Status  08/24/2022 16 9 - 46 U/L Final         Passed - Last BP in normal range    BP Readings from Last 1 Encounters:  09/17/22 122/64         Passed - Valid encounter within last 12 months    Recent Outpatient Visits           3 weeks ago Anxiety   [facility] Margarita Mail, DO   1 month ago Bright red rectal bleeding   [facility] Mecum, Oswaldo Conroy, PA-C   3 months ago Paroxysmal atrial fibrillation [facility])   [facility] Margarita Mail, DO   9 months ago Paroxysmal atrial fibrillation [facility])   [facility] Margarita Mail, DO       Future Appointments             In 2 weeks Margarita Mail, DO [facility]   In 3 months Margarita Mail, DO [facility], [facility]

## 2022-10-16 ENCOUNTER — Other Ambulatory Visit: Payer: Self-pay | Admitting: Internal Medicine

## 2022-10-16 DIAGNOSIS — N529 Male erectile dysfunction, unspecified: Secondary | ICD-10-CM

## 2022-10-18 NOTE — Telephone Encounter (Signed)
D/C 07/02/22. Requested Prescriptions  Refused Prescriptions Disp Refills   tadalafil (CIALIS) 10 MG tablet [Pharmacy Med Name: TADALAFIL 10 MG TABLET] 8 tablet 1    Sig: TAKE 1 TABLET (10 MG TOTAL) BY MOUTH EVERY OTHER DAY AS NEEDED FOR ERECTILE DYSFUNCTION.     Urology: Erectile Dysfunction Agents Passed - 10/16/2022  9:22 AM      Passed - AST in normal range and within 360 days    AST  Date Value Ref Range Status  08/24/2022 17 10 - 40 U/L Final         Passed - ALT in normal range and within 360 days    ALT  Date Value Ref Range Status  08/24/2022 16 9 - 46 U/L Final         Passed - Last BP in normal range    BP Readings from Last 1 Encounters:  09/17/22 122/64         Passed - Valid encounter within last 12 months    Recent Outpatient Visits           1 month ago Anxiety   [facility]ita Mail, DO   1 month ago Bright red rectal bleeding   [facility] Mecum, Oswaldo Conroy, PA-C   3 months ago Paroxysmal atrial fibrillation [facility])   [facility] Margarita Mail, DO   9 months ago Paroxysmal atrial fibrillation [facility]   [facility] Margarita Mail, DO       Future Appointments             In 1 week Margarita Mail, DO [facility], [facility]   In 2 months Margarita Mail, DO [facility], [facility]

## 2022-10-28 NOTE — Progress Notes (Signed)
Established Office Visit  Subjective:     Patient ID: Joseph Joseph, male    DOB: 01/28/1996, 27 y.o.   MRN: [id number]  Chief Complaint  Patient presents with   Follow-up    HPI Patient is in today for recheck on medication.   Anxiety: -Has been on Buspar 7.5 mg BID for awhile now, started on Lexapro 5 mg at LOV, which he does like, feels like anxiety is better controlled but his appetite has increased and he's gained about 6 pounds in 6 weeks. Headaches also worse with the Lexapro at first, now seems better. -Duration:better -Anxious mood: yes  -Excessive worrying: yes  -Sweating: no -Nausea: no -Palpitations:yes     10/29/2022   11:00 AM 09/17/2022   10:23 AM 08/24/2022   10:58 AM 07/12/2022    8:59 AM 01/11/2022    9:20 AM  Depression screen PHQ 2/9  Decreased Interest 1 2 0 2 0  Down, Depressed, Hopeless 1 3 0 2 2  PHQ - 2 Score 2 5 0 4 2  Altered sleeping 0 1 0 1 0  Tired, decreased energy 1 2 0 1 1  Change in appetite 3 1 0 2 1  Feeling bad or failure about yourself  1 2 0 1 0  Trouble concentrating 0 1 0 0 0  Moving slowly or fidgety/restless 0 2 0 0 0  Suicidal thoughts 0  0 0 0  PHQ-9 Score 7 14 0 9 4  Difficult doing work/chores Not difficult at all Not difficult at all Not difficult at all Somewhat difficult Not difficult at all   We also discussed rectal bleeding at LOV, which today he states has resolved.  Review of Systems  Constitutional:  Negative for chills and fever.  Respiratory:  Negative for shortness of breath.   Cardiovascular:  Positive for palpitations. Negative for chest pain.  Gastrointestinal:  Negative for abdominal pain, blood in stool, constipation, diarrhea, heartburn, melena, nausea and vomiting.  Neurological:  Positive for headaches.  Psychiatric/Behavioral:  The patient is nervous/anxious.         Objective:    BP 122/74   Pulse 71   Temp (!) 97.5 F (36.4 C)   Resp 18   Ht 5\' 8"  (1.727 m)   Wt 218 lb 12.8 oz (99.2  kg)   SpO2 98%   BMI 33.27 kg/m  BP Readings from Last 3 Encounters:  10/29/22 122/74  09/17/22 122/64  08/24/22 104/62   Wt Readings from Last 3 Encounters:  10/29/22 218 lb 12.8 oz (99.2 kg)  09/17/22 212 lb 12.8 oz (96.5 kg)  08/24/22 215 lb 12.8 oz (97.9 kg)      Physical Exam Constitutional:      Appearance: Normal appearance.  HENT:     Head: Normocephalic and atraumatic.  Eyes:     Conjunctiva/sclera: Conjunctivae normal.  Cardiovascular:     Rate and Rhythm: Normal rate and regular rhythm.  Pulmonary:     Effort: Pulmonary effort is normal.     Breath sounds: Normal breath sounds.  Skin:    General: Skin is warm and dry.  Neurological:     General: No focal deficit present.     Mental Status: He is alert. Mental status is at baseline.  Psychiatric:        Mood and Affect: Mood normal.        Behavior: Behavior normal.     No results found for any visits on 10/29/22.  Assessment & Plan:   1. Anxiety: Anxiety improved with the Lexapro but experiencing weight gain, headaches. Will continue Lexapro for 2 weeks and then discontinue, add Wellbutrin XL 150 mg daily as well. Follow up here in 4 weeks to recheck.   - buPROPion (WELLBUTRIN XL) 150 MG 24 hr tablet; Take 1 tablet (150 mg total) by mouth daily.  Dispense: 30 tablet; Refill: 1   Return in about 4 weeks (around 11/26/2022).  Margarita Mail, DO

## 2022-10-29 ENCOUNTER — Ambulatory Visit: Payer: PRIVATE HEALTH INSURANCE | Admitting: Internal Medicine

## 2022-10-29 ENCOUNTER — Encounter: Payer: Self-pay | Admitting: Internal Medicine

## 2022-10-29 VITALS — BP 122/74 | HR 71 | Temp 97.5°F | Resp 18 | Ht 68.0 in | Wt 218.8 lb

## 2022-10-29 DIAGNOSIS — F419 Anxiety disorder, unspecified: Secondary | ICD-10-CM

## 2022-10-29 MED ORDER — BUPROPION HCL ER (XL) 150 MG PO TB24
150.0000 mg | ORAL_TABLET | Freq: Every day | ORAL | 1 refills | Status: DC
Start: 2022-10-29 — End: 2022-11-26

## 2022-10-29 NOTE — Patient Instructions (Addendum)
It was great seeing you today!  Plan discussed at today's visit: -Continue Lexapro 5 mg for another 2 weeks then stop -Start Wellbutrin XL 150 mg daily   Follow up in: 4 weeks   Take care and let us know if you have any questions or concerns prior to your next visit.  Dr. Caralee Ates

## 2022-11-09 ENCOUNTER — Encounter: Payer: Self-pay | Admitting: Internal Medicine

## 2022-11-09 NOTE — Progress Notes (Signed)
Name: Joseph Joseph   MRN: [id number]    DOB: 05/12/1995   Date:11/15/2022       Progress Note  Subjective  Chief Complaint  Chief Complaint  Patient presents with   Annual Exam    HPI  Patient presents for annual CPE.  IPSS Questionnaire (AUA-7): Over the past month.   1)  How often have you had a sensation of not emptying your bladder completely after you finish urinating?  0 - Not at all  2)  How often have you had to urinate again less than two hours after you finished urinating? 0 - Not at all  3)  How often have you found you stopped and started again several times when you urinated?  0 - Not at all  4) How difficult have you found it to postpone urination?  0 - Not at all  5) How often have you had a weak urinary stream?  0 - Not at all  6) How often have you had to push or strain to begin urination?  0 - Not at all  7) How many times did you most typically get up to urinate from the time you went to bed until the time you got up in the morning?  0 - None  Total score:  0-7 mildly symptomatic   8-19 moderately symptomatic   20-35 severely symptomatic     Diet: working on diet, eats out a lot Exercise: twice a week, does to the gym Last Dental Exam: UTD Last Eye Exam: Needs appointment   Depression: phq 9 is negative - currently on Wellbutrin 150 mg XL, having some side effects but also on Lexapro 5 mg. Not currently on Buspar    11/15/2022    1:45 PM 10/29/2022   11:00 AM 09/17/2022   10:23 AM 08/24/2022   10:58 AM 07/12/2022    8:59 AM  Depression screen PHQ 2/9  Decreased Interest 1 1 2  0 2  Down, Depressed, Hopeless 0 1 3 0 2  PHQ - 2 Score 1 2 5  0 4  Altered sleeping 0 0 1 0 1  Tired, decreased energy 0 1 2 0 1  Change in appetite 0 3 1 0 2  Feeling bad or failure about yourself  0 1 2 0 1  Trouble concentrating 0 0 1 0 0  Moving slowly or fidgety/restless 0 0 2 0 0  Suicidal thoughts 0 0  0 0  PHQ-9 Score 1 7 14  0 9  Difficult doing work/chores Not  difficult at all Not difficult at all Not difficult at all Not difficult at all Somewhat difficult    Hypertension:  BP Readings from Last 3 Encounters:  11/15/22 122/64  10/29/22 122/74  09/17/22 122/64    Obesity: Wt Readings from Last 3 Encounters:  11/15/22 218 lb 3.2 oz (99 kg)  10/29/22 218 lb 12.8 oz (99.2 kg)  09/17/22 212 lb 12.8 oz (96.5 kg)   BMI Readings from Last 3 Encounters:  11/15/22 33.18 kg/m  10/29/22 33.27 kg/m  09/17/22 32.36 kg/m     Lipids:  Lab Results  Component Value Date   CHOL 158 11/01/2018   Lab Results  Component Value Date   HDL 50.70 11/01/2018   Lab Results  Component Value Date   LDLCALC 97 11/01/2018   Lab Results  Component Value Date   TRIG 52.0 11/01/2018   Lab Results  Component Value Date   CHOLHDL 3 11/01/2018   No results found for: "  LDLDIRECT" Glucose:  Glucose, Bld  Date Value Ref Range Status  08/24/2022 85 65 - 99 mg/dL Final    Comment:    .            Fasting reference interval .   09/18/2021 94 70 - 99 mg/dL Final    Comment:    Glucose reference range applies only to samples taken after fasting for at least 8 hours.  08/05/2021 93 70 - 99 mg/dL Final    Comment:    Glucose reference range applies only to samples taken after fasting for at least 8 hours.    Flowsheet Row Office Visit from 09/17/2022 [facility]  AUDIT-C Score 4       Single STD testing and prevention (HIV/chl/gon/syphilis):  no concerns  HIV screen :Complete Hep C Screening: Complete Skin cancer: Discussed monitoring for atypical lesions Colorectal cancer: N/A Prostate cancer:  not applicable No results found for: "PSA"   Lung cancer:  Low Dose CT Chest recommended if Age 29-80 years, 30 pack-year currently smoking OR have quit w/in 15years. Patient  not applicable a candidate for screening   AAA: The USPSTF recommends one-time screening with ultrasonography in men ages 30 to 75 years who  have ever smoked. Patient   not applicable, a candidate for screening  ECG:  N/A  Vaccines:   HPV: Completed  Tdap: Declined  Shingrix: N/A Pneumonia: N/A Flu: 01/11/22 COVID-19: Declined  Advanced Care Planning: A voluntary discussion about advance care planning including the explanation and discussion of advance directives.  Discussed health care proxy and Living will, and the patient was able to identify a health care proxy as mother Alicia Amel.  Patient does not have a living will and power of attorney of health care   Patient Active Problem List   Diagnosis Date Noted   Paroxysmal atrial fibrillation (HCC) 01/11/2022   Anxiety 01/11/2022   Gastroesophageal reflux disease 01/11/2022   BMI 32.0-32.9,adult 11/01/2018    History reviewed. No pertinent surgical history.  Family History  Problem Relation Age of Onset   Heart disease Mother    GER disease Mother     Social History   Socioeconomic History   Marital status: Single    Spouse name: Not on file   Number of children: Not on file   Years of education: Not on file   Highest education level: Some college, no degree  Occupational History   Not on file  Tobacco Use   Smoking status: Former   Smokeless tobacco: Never  Vaping Use   Vaping status: Never Used  Substance and Sexual Activity   Alcohol use: Not Currently    Comment: 4-5 drinks per week   Drug use: Never   Sexual activity: Yes  Other Topics Concern   Not on file  Social History Narrative   Not on file   Social Determinants of Health   Financial Resource Strain: Low Risk  (11/15/2022)   Overall Financial Resource Strain (CARDIA)    Difficulty of Paying Living Expenses: Not hard at all  Food Insecurity: No Food Insecurity (11/15/2022)   Hunger Vital Sign    Worried About Running Out of Food in the Last Year: Never true    Ran Out of Food in the Last Year: Never true  Recent Concern: Food Insecurity - Food Insecurity Present (09/17/2022)    Hunger Vital Sign    Worried About Running Out of Food in the Last Year: Sometimes true  Ran Out of Food in the Last Year: Never true  Transportation Needs: No Transportation Needs (11/15/2022)   PRAPARE - Administrator, Civil Service (Medical): No    Lack of Transportation (Non-Medical): No  Physical Activity: Insufficiently Active (11/15/2022)   Exercise Vital Sign    Days of Exercise per Week: 2 days    Minutes of Exercise per Session: 20 min  Stress: Stress Concern Present (11/15/2022)   [employer] of Occupational Health - Occupational Stress Questionnaire    Feeling of Stress : To some extent  Social Connections: Socially Isolated (11/15/2022)   Social Connection and Isolation Panel [NHANES]    Frequency of Communication with Friends and Family: Twice a week    Frequency of Social Gatherings with Friends and Family: Once a week    Attends Religious Services: Never    Database administrator or Organizations: No    Attends Banker Meetings: Never    Marital Status: Never married  Intimate Partner Violence: Not At Risk (11/15/2022)   Humiliation, Afraid, Rape, and Kick questionnaire    Fear of Current or Ex-Partner: No    Emotionally Abused: No    Physically Abused: No    Sexually Abused: No     Current Outpatient Medications:    buPROPion (WELLBUTRIN XL) 150 MG 24 hr tablet, Take 1 tablet (150 mg total) by mouth daily., Disp: 30 tablet, Rfl: 1   busPIRone (BUSPAR) 7.5 MG tablet, Take 1 tablet (7.5 mg total) by mouth every 12 (twelve) hours as needed (anxiety)., Disp: 180 tablet, Rfl: 1   escitalopram (LEXAPRO) 5 MG tablet, Take 1 tablet (5 mg total) by mouth daily., Disp: 30 tablet, Rfl: 1   metoprolol succinate (TOPROL-XL) 50 MG 24 hr tablet, TAKE 1 TABLET BY MOUTH DAILY. TAKE WITH OR IMMEDIATELY FOLLOWING A MEAL., Disp: 90 tablet, Rfl: 0   tadalafil (CIALIS) 5 MG tablet, TAKE 1 TABLET (5 MG TOTAL) BY MOUTH AS NEEDED FOR ERECTILE DYSFUNCTION.,  Disp: 90 tablet, Rfl: 0  Allergies  Allergen Reactions   Rocephin [Ceftriaxone] Hives     Review of Systems  All other systems reviewed and are negative.     Objective  Vitals:   11/15/22 1336  BP: 122/64  Pulse: 62  Resp: 18  Temp: 97.6 F (36.4 C)  SpO2: 98%  Weight: 218 lb 3.2 oz (99 kg)  Height: 5\' 8"  (1.727 m)    Body mass index is 33.18 kg/m.  Physical Exam Constitutional:      Appearance: Normal appearance.  HENT:     Head: Normocephalic and atraumatic.     Mouth/Throat:     Mouth: Mucous membranes are moist.     Pharynx: Oropharynx is clear.  Eyes:     Extraocular Movements: Extraocular movements intact.     Conjunctiva/sclera: Conjunctivae normal.     Pupils: Pupils are equal, round, and reactive to light.  Neck:     Comments: No thyromegaly  Cardiovascular:     Rate and Rhythm: Normal rate and regular rhythm.  Pulmonary:     Effort: Pulmonary effort is normal.     Breath sounds: Normal breath sounds.  Musculoskeletal:     Cervical back: No tenderness.     Right lower leg: No edema.     Left lower leg: No edema.  Lymphadenopathy:     Cervical: No cervical adenopathy.  Skin:    General: Skin is warm and dry.  Neurological:     General: No focal  deficit present.     Mental Status: He is alert. Mental status is at baseline.  Psychiatric:        Mood and Affect: Mood normal.        Behavior: Behavior normal.     Recent Results (from the past 2160 hour(s))  CBC w/Diff/Platelet     Status: None   Collection Time: 08/24/22 11:45 AM  Result Value Ref Range   WBC 7.5 3.8 - 10.8 Thousand/uL   RBC 4.58 4.20 - 5.80 Million/uL   Hemoglobin 14.2 13.2 - 17.1 g/dL   HCT 38.7 56.4 - 33.2 %   MCV 93.4 80.0 - 100.0 fL   MCH 31.0 27.0 - 33.0 pg   MCHC 33.2 32.0 - 36.0 g/dL   RDW 95.1 88.4 - 16.6 %   Platelets 242 140 - 400 Thousand/uL   MPV 10.1 7.5 - 12.5 fL   Neutro Abs 5,078 1,500 - 7,800 cells/uL   Lymphs Abs 1,733 850 - 3,900 cells/uL    Absolute Monocytes 525 200 - 950 cells/uL   Eosinophils Absolute 128 15 - 500 cells/uL   Basophils Absolute 38 0 - 200 cells/uL   Neutrophils Relative % 67.7 %   Total Lymphocyte 23.1 %   Monocytes Relative 7.0 %   Eosinophils Relative 1.7 %   Basophils Relative 0.5 %  COMPLETE METABOLIC PANEL WITH GFR     Status: None   Collection Time: 08/24/22 11:45 AM  Result Value Ref Range   Glucose, Bld 85 65 - 99 mg/dL    Comment: .            Fasting reference interval .    BUN 9 7 - 25 mg/dL   Creat 0.63 0.16 - 0.10 mg/dL   eGFR 932 > OR = 60 TF/TDD/2.20U5   BUN/Creatinine Ratio SEE NOTE: 6 - 22 (calc)    Comment:    Not Reported: BUN and Creatinine are within    reference range. .    Sodium 140 135 - 146 mmol/L   Potassium 4.7 3.5 - 5.3 mmol/L   Chloride 104 98 - 110 mmol/L   CO2 28 20 - 32 mmol/L   Calcium 9.6 8.6 - 10.3 mg/dL   Total Protein 7.3 6.1 - 8.1 g/dL   Albumin 4.5 3.6 - 5.1 g/dL   Globulin 2.8 1.9 - 3.7 g/dL (calc)   AG Ratio 1.6 1.0 - 2.5 (calc)   Total Bilirubin 0.6 0.2 - 1.2 mg/dL   Alkaline phosphatase (APISO) 50 36 - 130 U/L   AST 17 10 - 40 U/L   ALT 16 9 - 46 U/L  PTT     Status: None   Collection Time: 08/24/22 11:45 AM  Result Value Ref Range   aPTT 31 23 - 32 sec    Comment: . This test has not been validated for monitoring unfractionated heparin therapy. For testing that is validated for this type of therapy, please refer to the Heparin Anti-Xa assay (test code 42706). . For additional information, please refer to http://education.[facility].com/faq/FAQ159 (This link is being provided for  informational/educational purposes only.)   POC Hemoccult Bld/Stl (3-Cd Home Screen)     Status: None   Collection Time: 09/21/22 10:36 AM  Result Value Ref Range   Card #1 Date [id number]    Fecal Occult Blood, POC Negative Negative   Card #2 Date 1517616    Card #2 Fecal Occult Blod, POC Negative    Card #3 Date [id number]    Card #3  Fecal Occult Blood,  POC Negative      Fall Risk:    11/15/2022    1:45 PM 10/29/2022   10:55 AM 09/17/2022   10:18 AM 08/24/2022   10:58 AM 07/12/2022    8:56 AM  Fall Risk   Falls in the past year? 0 0 1 0 0  Number falls in past yr: 0 0 0 0 0  Injury with Fall? 0 0 0 0 0  Risk for fall due to :    No Fall Risks   Follow up    Falls prevention discussed;Education provided;Falls evaluation completed      Functional Status Survey: Is the patient deaf or have difficulty hearing?: No Does the patient have difficulty seeing, even when wearing glasses/contacts?: No Does the patient have difficulty concentrating, remembering, or making decisions?: No Does the patient have difficulty walking or climbing stairs?: No Does the patient have difficulty dressing or bathing?: No Does the patient have difficulty doing errands alone such as visiting a doctor's office or shopping?: No    Assessment & Plan  1. Annual physical exam: Health maintenance and labs up to date. Wellbutrin causing some off and on side effects but moods are stable, will stop Lexapro and add back Buspar, continue Wellbutrin and keep follow up in 2 weeks.   -Prostate cancer screening and PSA options (with potential risks and benefits of testing vs not testing) were discussed along with recent recs/guidelines. -USPSTF grade A and B recommendations reviewed with patient; age-appropriate recommendations, preventive care, screening tests, etc discussed and encouraged; healthy living encouraged; see AVS for patient education given to patient -Discussed importance of 150 minutes of physical activity weekly, eat two servings of fish weekly, eat one serving of tree nuts ( cashews, pistachios, pecans, almonds.Marland Kitchen) every other day, eat 6 servings of fruit/vegetables daily and drink plenty of water and avoid sweet beverages.  -Reviewed Health Maintenance: yes

## 2022-11-15 ENCOUNTER — Encounter: Payer: Self-pay | Admitting: Internal Medicine

## 2022-11-15 ENCOUNTER — Ambulatory Visit (INDEPENDENT_AMBULATORY_CARE_PROVIDER_SITE_OTHER): Payer: PRIVATE HEALTH INSURANCE | Admitting: Internal Medicine

## 2022-11-15 ENCOUNTER — Other Ambulatory Visit: Payer: Self-pay

## 2022-11-15 VITALS — BP 122/64 | HR 62 | Temp 97.6°F | Resp 18 | Ht 68.0 in | Wt 218.2 lb

## 2022-11-15 DIAGNOSIS — Z0000 Encounter for general adult medical examination without abnormal findings: Secondary | ICD-10-CM | POA: Diagnosis not present

## 2022-11-15 DIAGNOSIS — F419 Anxiety disorder, unspecified: Secondary | ICD-10-CM

## 2022-11-16 ENCOUNTER — Other Ambulatory Visit: Payer: Self-pay | Admitting: Internal Medicine

## 2022-11-16 ENCOUNTER — Encounter: Payer: Self-pay | Admitting: Internal Medicine

## 2022-11-16 DIAGNOSIS — F419 Anxiety disorder, unspecified: Secondary | ICD-10-CM

## 2022-11-17 NOTE — Telephone Encounter (Signed)
Requested medication (s) are due for refill today: -  Requested medication (s) are on the active medication list:yes  Last refill:  see note Future visit scheduled:yes  Notes to clinic:  per last OV this med was to be d/c'd it was refused by PCP "refill not apprpriate" please dc to take off list and so pharmacy will be notified if appropriate.   Requested Prescriptions  Pending Prescriptions Disp Refills   escitalopram (LEXAPRO) 5 MG tablet [Pharmacy Med Name: ESCITALOPRAM 5 MG TABLET] 30 tablet 1    Sig: Take 1 tablet (5 mg total) by mouth daily.     Psychiatry:  Antidepressants - SSRI Passed - 11/16/2022 11:02 AM      Passed - Valid encounter within last 6 months    Recent Outpatient Visits           2 days ago Annual physical exam   [facility]ita Mail, [facility]   2 weeks ago Anxiety   [facility], DO   2 months ago Anxiety   [facility]arita Mail, DO   2 months ago Bright red rectal bleeding   [facility] Mecum, Oswaldo Conroy, PA-C   4 months ago Paroxysmal atrial fibrillation [facility])   [facility] Margarita Mail, DO       Future Appointments             In 1 week Margarita Mail, DO [facility], [facility]   In 1 month Margarita Mail, DO [facility], [facility]

## 2022-11-21 ENCOUNTER — Other Ambulatory Visit: Payer: Self-pay | Admitting: Internal Medicine

## 2022-11-21 DIAGNOSIS — F419 Anxiety disorder, unspecified: Secondary | ICD-10-CM

## 2022-11-23 NOTE — Telephone Encounter (Signed)
Requested medications are due for refill today.  no  Requested medications are on the active medications list.  yes  Last refill. 10/29/2022 #30 1 rf  Future visit scheduled.   yes  Notes to clinic.  Request for a 90 day supply.    Requested Prescriptions  Pending Prescriptions Disp Refills   buPROPion (WELLBUTRIN XL) 150 MG 24 hr tablet [Pharmacy Med Name: BUPROPION HCL XL 150 MG TABLET] 90 tablet 1    Sig: TAKE 1 TABLET BY MOUTH EVERY DAY     Psychiatry: Antidepressants - bupropion Passed - 11/21/2022 12:30 PM      Passed - Cr in normal range and within 360 days    Creat  Date Value Ref Range Status  08/24/2022 0.84 0.60 - 1.24 mg/dL Final         Passed - AST in normal range and within 360 days    AST  Date Value Ref Range Status  08/24/2022 17 10 - 40 U/L Final         Passed - ALT in normal range and within 360 days    ALT  Date Value Ref Range Status  08/24/2022 16 9 - 46 U/L Final         Passed - Last BP in normal range    BP Readings from Last 1 Encounters:  11/15/22 122/64         Passed - Valid encounter within last 6 months    Recent Outpatient Visits           1 week ago Annual physical exam   [facility] Margarita Mail, DO   3 weeks ago Anxiety   [facility] Margarita Mail, DO   2 months ago Anxiety   [facility]arita Mail, DO   3 months ago Bright red rectal bleeding   [facility] Mecum, Oswaldo Conroy, PA-C   4 months ago Paroxysmal atrial fibrillation [facility])   [facility] Margarita Mail, DO       Future Appointments             In 3 days Margarita Mail, DO [facility], [facility]   In 1 month Margarita Mail, DO [facility], [facility]

## 2022-11-25 ENCOUNTER — Other Ambulatory Visit: Payer: Self-pay | Admitting: Internal Medicine

## 2022-11-25 DIAGNOSIS — I480 Paroxysmal atrial fibrillation: Secondary | ICD-10-CM

## 2022-11-25 NOTE — Progress Notes (Signed)
Established Office Visit  Subjective:     Patient ID: Joseph Joseph, male    DOB: December 21, 1995, 27 y.o.   MRN: [id number]  Chief Complaint  Patient presents with   Follow-up    HPI Patient is in today for recheck on medication.   Anxiety: -Now on Wellbutrin 150 mg XL, Buspar. Lexapro discontinued.  -Duration: weeks  -Today patient states that he continues to have "zap" in his head whenever he stands up.  This will last only a few seconds but he will feel lightheaded.  This happens several times throughout the day and has been occurring since he has been on the Wellbutrin. -Failed medications: Lexapro due to weight gain     11/26/2022   11:28 AM 11/15/2022    1:45 PM 10/29/2022   11:00 AM 09/17/2022   10:23 AM 08/24/2022   10:58 AM  Depression screen PHQ 2/9  Decreased Interest 0 1 1 2  0  Down, Depressed, Hopeless 1 0 1 3 0  PHQ - 2 Score 1 1 2 5  0  Altered sleeping 0 0 0 1 0  Tired, decreased energy 1 0 1 2 0  Change in appetite 2 0 3 1 0  Feeling bad or failure about yourself  0 0 1 2 0  Trouble concentrating 0 0 0 1 0  Moving slowly or fidgety/restless 0 0 0 2 0  Suicidal thoughts 0 0 0  0  PHQ-9 Score 4 1 7 14  0  Difficult doing work/chores Not difficult at all Not difficult at all Not difficult at all Not difficult at all Not difficult at all   We also discussed rectal bleeding at LOV, which today he states has resolved.  Review of Systems  Neurological:  Positive for dizziness.  Psychiatric/Behavioral:  The patient is nervous/anxious.         Objective:    BP 122/66   Pulse 96   Temp (!) 97.5 F (36.4 C)   Resp 18   Ht 5\' 8"  (1.727 m)   Wt 220 lb 14.4 oz (100.2 kg)   SpO2 99%   BMI 33.59 kg/m  BP Readings from Last 3 Encounters:  11/26/22 122/66  11/15/22 122/64  10/29/22 122/74   Wt Readings from Last 3 Encounters:  11/26/22 220 lb 14.4 oz (100.2 kg)  11/15/22 218 lb 3.2 oz (99 kg)  10/29/22 218 lb 12.8 oz (99.2 kg)      Physical  Exam Constitutional:      Appearance: Normal appearance.  HENT:     Head: Normocephalic and atraumatic.  Eyes:     Conjunctiva/sclera: Conjunctivae normal.  Cardiovascular:     Rate and Rhythm: Normal rate and regular rhythm.  Pulmonary:     Effort: Pulmonary effort is normal.     Breath sounds: Normal breath sounds.  Skin:    General: Skin is warm and dry.  Neurological:     General: No focal deficit present.     Mental Status: He is alert. Mental status is at baseline.  Psychiatric:        Mood and Affect: Mood normal.        Behavior: Behavior normal.     No results found for any visits on 11/26/22.      Assessment & Plan:   1. Anxiety: Appears to be having some orthostatic hypotension side effects from the Wellbutrin, will discontinue.  We will try Prozac 10 mg to better control her anxiety.  Follow-up in 6 weeks to recheck.  -  FLUoxetine (PROZAC) 10 MG capsule; Take 1 capsule (10 mg total) by mouth daily.  Dispense: 30 capsule; Refill: 1   Return in about 6 weeks (around 01/07/2023).  Margarita Mail, DO

## 2022-11-25 NOTE — Telephone Encounter (Signed)
Requested Prescriptions  Pending Prescriptions Disp Refills   metoprolol succinate (TOPROL-XL) 50 MG 24 hr tablet [Pharmacy Med Name: METOPROLOL SUCC ER 50 MG TAB] 90 tablet 0    Sig: TAKE 1 TABLET BY MOUTH EVERY DAY WITH OR IMMEDIATELY FOLLOWING A MEAL     Cardiovascular:  Beta Blockers Passed - 11/25/2022  2:41 AM      Passed - Last BP in normal range    BP Readings from Last 1 Encounters:  11/15/22 122/64         Passed - Last Heart Rate in normal range    Pulse Readings from Last 1 Encounters:  11/15/22 62         Passed - Valid encounter within last 6 months    Recent Outpatient Visits           1 week ago Annual physical exam   [facility] Margarita Mail, DO   3 weeks ago Anxiety   [facility] Margarita Mail, DO   2 months ago Anxiety   [facility] Margarita Mail, DO   3 months ago Bright red rectal bleeding   [facility] Mecum, Oswaldo Conroy, PA-C   4 months ago Paroxysmal atrial fibrillation [facility])   [facility] Margarita Mail, DO       Future Appointments             Tomorrow Margarita Mail, DO [facility], [facility]   In 1 month Margarita Mail, DO [facility], [facility]

## 2022-11-26 ENCOUNTER — Ambulatory Visit (INDEPENDENT_AMBULATORY_CARE_PROVIDER_SITE_OTHER): Payer: PRIVATE HEALTH INSURANCE | Admitting: Internal Medicine

## 2022-11-26 ENCOUNTER — Other Ambulatory Visit: Payer: Self-pay | Admitting: Internal Medicine

## 2022-11-26 ENCOUNTER — Encounter: Payer: Self-pay | Admitting: Internal Medicine

## 2022-11-26 VITALS — BP 122/66 | HR 96 | Temp 97.5°F | Resp 18 | Ht 68.0 in | Wt 220.9 lb

## 2022-11-26 DIAGNOSIS — F419 Anxiety disorder, unspecified: Secondary | ICD-10-CM

## 2022-11-26 MED ORDER — FLUOXETINE HCL 10 MG PO CAPS
10.0000 mg | ORAL_CAPSULE | Freq: Every day | ORAL | 1 refills | Status: DC
Start: 2022-11-26 — End: 2022-12-20

## 2022-11-26 NOTE — Telephone Encounter (Signed)
Unable to refill per protocol, Rx expired. Discontinued 11/26/22.  Requested Prescriptions  Pending Prescriptions Disp Refills   buPROPion (WELLBUTRIN XL) 150 MG 24 hr tablet [Pharmacy Med Name: BUPROPION HCL XL 150 MG TABLET] 30 tablet 1    Sig: TAKE 1 TABLET BY MOUTH EVERY DAY     Psychiatry: Antidepressants - bupropion Passed - 11/26/2022 12:53 PM      Passed - Cr in normal range and within 360 days    Creat  Date Value Ref Range Status  08/24/2022 0.84 0.60 - 1.24 mg/dL Final         Passed - AST in normal range and within 360 days    AST  Date Value Ref Range Status  08/24/2022 17 10 - 40 U/L Final         Passed - ALT in normal range and within 360 days    ALT  Date Value Ref Range Status  08/24/2022 16 9 - 46 U/L Final         Passed - Last BP in normal range    BP Readings from Last 1 Encounters:  11/26/22 122/66         Passed - Valid encounter within last 6 months    Recent Outpatient Visits           Today Anxiety   [facility] Marg[facility] Mail, DO   1 week ago Annual physical exam   [facility]ita Mail, DO   4 weeks ago Anxiety   [facility] Margarita Mail, DO   2 months ago Anxiety   [facility] Margarita Mail, DO   3 months ago Bright red rectal bleeding   [facility] Mecum, Oswaldo Conroy, PA-C       Future Appointments             In 1 month Margarita Mail, DO [facility], [facility]   In 1 month Margarita Mail, DO [facility], [facility]

## 2022-11-26 NOTE — Patient Instructions (Signed)
Fluoxetine Capsules or Tablets (Depression/Mood Disorders) What is this medication? FLUOXETINE (floo OX e teen) treats depression, anxiety, obsessive-compulsive disorder (OCD), and eating disorders. It increases the amount of serotonin in the brain, a hormone that helps regulate mood. It belongs to a group of medications called SSRIs. This medicine may be used for other purposes; ask your health care provider or pharmacist if you have questions. COMMON BRAND NAME(S): Prozac What should I tell my care team before I take this medication? They need to know if you have any of these conditions: Bipolar disorder or a family history of bipolar disorder Bleeding disorder Glaucoma Heart disease Liver disease Low levels of sodium in the blood Seizures Suicidal thoughts, plans, or attempt by you or a family member Take an MAOI, such as Carbex, Eldepryl, Marplan, Nardil, or Parnate Take medications that treat or prevent blood clots Thyroid disease An unusual or allergic reaction to fluoxetine, other medications, foods, dyes, or preservatives Pregnant or trying to get pregnant Breastfeeding How should I use this medication? Take this medication by mouth with a glass of water. Follow the directions on the prescription label. You can take this medication with or without food. Take your medication at regular intervals. Do not take it more often than directed. Do not stop taking this medication suddenly except upon the advice of your care team. Stopping this medication too quickly may cause serious side effects or your condition may worsen. A special MedGuide will be given to you by the pharmacist with each prescription and refill. Be sure to read this information carefully each time. Talk to your care team about the use of this medication in children. While it may be prescribed for children as young as 7 years for selected conditions, precautions do apply. Overdosage: If you think you have taken too much of  this medicine contact a poison control center or emergency room at once. NOTE: This medicine is only for you. Do not share this medicine with others. What if I miss a dose? If you miss a dose, skip the missed dose and go back to your regular dosing schedule. Do not take double or extra doses. What may interact with this medication? Do not take this medication with any of the following: Other medications containing fluoxetine, such as Sarafem or Symbyax Cisapride Dronedarone Linezolid MAOIs, such as Carbex, Eldepryl, Marplan, Nardil, and Parnate Methylene blue (injected into a vein) Pimozide Thioridazine This medication may also interact with the following: Alcohol Amphetamines Aspirin and aspirin-like medications Carbamazepine Certain medications for mental health conditions Certain medications for migraine headache, such as almotriptan, eletriptan, frovatriptan, naratriptan, rizatriptan, sumatriptan, zolmitriptan Digoxin Diuretics Fentanyl Flecainide Furazolidone Isoniazid Lithium Medications that help you fall asleep Medications that treat or prevent blood clots, such as warfarin, enoxaparin, and dalteparin NSAIDs, medications for pain and inflammation, such as ibuprofen or naproxen Other medications that cause heart rhythm changes Phenytoin Procarbazine Propafenone Rasagiline Ritonavir Supplements, such as St. John's wort, kava kava, valerian Tramadol Tryptophan Vinblastine This list may not describe all possible interactions. Give your health care provider a list of all the medicines, herbs, non-prescription drugs, or dietary supplements you use. Also tell them if you smoke, drink alcohol, or use illegal drugs. Some items may interact with your medicine. What should I watch for while using this medication? Tell your care team if your symptoms do not get better or if they get worse. Visit your care team for regular checks on your progress. Because it may take several  weeks to see  the full effects of this medication, it is important to continue your treatment as prescribed. Watch for new or worsening thoughts of suicide or depression. This includes sudden changes in mood, behavior, or thoughts. These changes can happen at any time but are more common in the beginning of treatment or after a change in dose. Call your care team right away if you experience these thoughts or worsening depression. This medication may cause mood and behavior changes, such as anxiety, nervousness, irritability, hostility, restlessness, excitability, hyperactivity, or trouble sleeping. These changes can happen at any time but are more common in the beginning of treatment or after a change in dose. Call your care team right away if you notice any of these symptoms. This medication may affect your coordination, reaction time, or judgment. Do not drive or operate machinery until you know how this medication affects you. Sit up or stand slowly to reduce the risk of dizzy or fainting spells. Drinking alcohol with this medication can increase the risk of these side effects. Your mouth may get dry. Chewing sugarless gum or sucking hard candy and drinking plenty of water may help. Contact your care team if the problem does not go away or is severe. This medication may affect blood sugar levels. If you have diabetes, check with your care team before you make changes to your diet or medications. What side effects may I notice from receiving this medication? Side effects that you should report to your care team as soon as possible: Allergic reactions--skin rash, itching, hives, swelling of the face, lips, tongue, or throat Bleeding--bloody or black, tar-like stools, red or dark brown urine, vomiting blood or brown material that looks like coffee grounds, small, red or purple spots on skin, unusual bleeding or bruising Heart rhythm changes--fast or irregular heartbeat, dizziness, feeling faint or  lightheaded, chest pain, trouble breathing Loss of appetite with weight loss Low sodium level--muscle weakness, fatigue, dizziness, headache, confusion Serotonin syndrome--irritability, confusion, fast or irregular heartbeat, muscle stiffness, twitching muscles, sweating, high fever, seizure, chills, vomiting, diarrhea Sudden eye pain or change in vision such as blurry vision, seeing halos around lights, vision loss Thoughts of suicide or self-harm, worsening mood, feelings of depression Side effects that usually do not require medical attention (report to your care team if they continue or are bothersome): Anxiety, nervousness Change in sex drive or performance Diarrhea Dry mouth Headache Excessive sweating Nausea Tremors or shaking Trouble sleeping Upset stomach This list may not describe all possible side effects. Call your doctor for medical advice about side effects. You may report side effects to FDA at 1-800-FDA-1088. Where should I keep my medication? Keep out of the reach of children and pets. Store at room temperature between 15 and 30 degrees C (59 and 86 degrees F). Get rid of any unused medication after the expiration date. NOTE: This sheet is a summary. It may not cover all possible information. If you have questions about this medicine, talk to your doctor, pharmacist, or health care provider.  2024 Elsevier/Gold Standard (2022-01-26 00:00:00)

## 2022-11-27 ENCOUNTER — Encounter: Payer: Self-pay | Admitting: Internal Medicine

## 2022-11-29 ENCOUNTER — Other Ambulatory Visit: Payer: Self-pay

## 2022-11-29 DIAGNOSIS — F419 Anxiety disorder, unspecified: Secondary | ICD-10-CM

## 2022-11-29 MED ORDER — BUSPIRONE HCL 7.5 MG PO TABS
7.5000 mg | ORAL_TABLET | Freq: Two times a day (BID) | ORAL | 1 refills | Status: DC | PRN
Start: 2022-11-29 — End: 2023-09-28

## 2022-12-18 ENCOUNTER — Other Ambulatory Visit: Payer: Self-pay | Admitting: Internal Medicine

## 2022-12-18 DIAGNOSIS — F419 Anxiety disorder, unspecified: Secondary | ICD-10-CM

## 2022-12-20 NOTE — Telephone Encounter (Signed)
Requested Prescriptions  Pending Prescriptions Disp Refills   FLUoxetine (PROZAC) 10 MG capsule [Pharmacy Med Name: FLUOXETINE HCL 10 MG CAPSULE] 90 capsule 1    Sig: TAKE 1 CAPSULE BY MOUTH EVERY DAY     Psychiatry:  Antidepressants - SSRI Passed - 12/18/2022 12:31 PM      Passed - Valid encounter within last 6 months    Recent Outpatient Visits           3 weeks ago Anxiety   [facility] Marg[facility]ita Mail, DO   1 month ago Annual physical exam   [facility] Mail, DO   1 month ago Anxiety   [facility] Margarita Mail, DO   3 months ago Anxiety   [facility] Margarita Mail, DO   3 months ago Bright red rectal bleeding   [facility] Mecum, Oswaldo Conroy, PA-C       Future Appointments             In 2 weeks Margarita Mail, DO [facility], [facility]   In 3 weeks Margarita Mail, DO [facility], [facility]

## 2023-01-05 NOTE — Progress Notes (Deleted)
   Established Office Visit  Subjective:     Patient ID: Joseph Joseph, male    DOB: 04-20-1996, 27 y.o.   MRN: [id number]  No chief complaint on file.   HPI Patient is in today for recheck on medication.   Anxiety: -Now on Prozac 10 mg -Duration: weeks  -Today patient states that he continues to have "zap" in his head whenever he stands up.  This will last only a few seconds but he will feel lightheaded.  This happens several times throughout the day and has been occurring since he has been on the Wellbutrin. -Failed medications: Lexapro due to weight gain, Wellbutrin due to orthostatic hypotension     11/26/2022   11:28 AM 11/15/2022    1:45 PM 10/29/2022   11:00 AM 09/17/2022   10:23 AM 08/24/2022   10:58 AM  Depression screen PHQ 2/9  Decreased Interest 0 1 1 2  0  Down, Depressed, Hopeless 1 0 1 3 0  PHQ - 2 Score 1 1 2 5  0  Altered sleeping 0 0 0 1 0  Tired, decreased energy 1 0 1 2 0  Change in appetite 2 0 3 1 0  Feeling bad or failure about yourself  0 0 1 2 0  Trouble concentrating 0 0 0 1 0  Moving slowly or fidgety/restless 0 0 0 2 0  Suicidal thoughts 0 0 0  0  PHQ-9 Score 4 1 7 14  0  Difficult doing work/chores Not difficult at all Not difficult at all Not difficult at all Not difficult at all Not difficult at all    Review of Systems  Neurological:  Positive for dizziness.  Psychiatric/Behavioral:  The patient is nervous/anxious.         Objective:    There were no vitals taken for this visit. BP Readings from Last 3 Encounters:  11/26/22 122/66  11/15/22 122/64  10/29/22 122/74   Wt Readings from Last 3 Encounters:  11/26/22 220 lb 14.4 oz (100.2 kg)  11/15/22 218 lb 3.2 oz (99 kg)  10/29/22 218 lb 12.8 oz (99.2 kg)      Physical Exam Constitutional:      Appearance: Normal appearance.  HENT:     Head: Normocephalic and atraumatic.  Eyes:     Conjunctiva/sclera: Conjunctivae normal.  Cardiovascular:     Rate and Rhythm: Normal rate and  regular rhythm.  Pulmonary:     Effort: Pulmonary effort is normal.     Breath sounds: Normal breath sounds.  Skin:    General: Skin is warm and dry.  Neurological:     General: No focal deficit present.     Mental Status: He is alert. Mental status is at baseline.  Psychiatric:        Mood and Affect: Mood normal.        Behavior: Behavior normal.     No results found for any visits on 01/07/23.      Assessment & Plan:   1. Anxiety: Appears to be having some orthostatic hypotension side effects from the Wellbutrin, will discontinue.  We will try Prozac 10 mg to better control her anxiety.  Follow-up in 6 weeks to recheck.  - FLUoxetine (PROZAC) 10 MG capsule; Take 1 capsule (10 mg total) by mouth daily.  Dispense: 30 capsule; Refill: 1   No follow-ups on file.  Margarita Mail, DO

## 2023-01-07 ENCOUNTER — Other Ambulatory Visit: Payer: Self-pay | Admitting: Internal Medicine

## 2023-01-07 ENCOUNTER — Ambulatory Visit: Payer: PRIVATE HEALTH INSURANCE | Admitting: Internal Medicine

## 2023-01-07 DIAGNOSIS — N529 Male erectile dysfunction, unspecified: Secondary | ICD-10-CM

## 2023-01-07 NOTE — Telephone Encounter (Signed)
Requested Prescriptions  Pending Prescriptions Disp Refills   tadalafil (CIALIS) 5 MG tablet [Pharmacy Med Name: TADALAFIL 5 MG TABLET] 90 tablet 0    Sig: TAKE 1 TABLET (5 MG TOTAL) BY MOUTH AS NEEDED FOR ERECTILE DYSFUNCTION.     Urology: Erectile Dysfunction Agents Passed - 01/07/2023  1:43 AM      Passed - AST in normal range and within 360 days    AST  Date Value Ref Range Status  08/24/2022 17 10 - 40 U/L Final         Passed - ALT in normal range and within 360 days    ALT  Date Value Ref Range Status  08/24/2022 16 9 - 46 U/L Final         Passed - Last BP in normal range    BP Readings from Last 1 Encounters:  11/26/22 122/66         Passed - Valid encounter within last 12 months    Recent Outpatient Visits           1 month ago Anxiety   [facility] Margarita Mail, DO   1 month ago Annual physical exam   [facility] Margarita Mail, DO   2 months ago Anxiety   [facility] Margarita Mail, DO   3 months ago Anxiety   [facility] Margarita Mail, DO   4 months ago Bright red rectal bleeding   [facility] Mecum, Oswaldo Conroy, PA-C       Future Appointments             In 5 days Margarita Mail, DO [facility], [facility]

## 2023-01-10 NOTE — Progress Notes (Unsigned)
Established Office Visit  Subjective:     Patient ID: Joseph Joseph, male    DOB: 1996-02-19, 27 y.o.   MRN: [id number]  No chief complaint on file.   HPI Patient is in today for recheck on medication.   Anxiety: -Now on Prozac 10 mg -Duration: weeks  -Today patient states that he continues to have "zap" in his head whenever he stands up.  This will last only a few seconds but he will feel lightheaded.  This happens several times throughout the day and has been occurring since he has been on the Wellbutrin. -Failed medications: Lexapro due to weight gain, Wellbutrin due to orthostatic hypotension     11/26/2022   11:28 AM 11/15/2022    1:45 PM 10/29/2022   11:00 AM 09/17/2022   10:23 AM 08/24/2022   10:58 AM  Depression screen PHQ 2/9  Decreased Interest 0 1 1 2  0  Down, Depressed, Hopeless 1 0 1 3 0  PHQ - 2 Score 1 1 2 5  0  Altered sleeping 0 0 0 1 0  Tired, decreased energy 1 0 1 2 0  Change in appetite 2 0 3 1 0  Feeling bad or failure about yourself  0 0 1 2 0  Trouble concentrating 0 0 0 1 0  Moving slowly or fidgety/restless 0 0 0 2 0  Suicidal thoughts 0 0 0  0  PHQ-9 Score 4 1 7 14  0  Difficult doing work/chores Not difficult at all Not difficult at all Not difficult at all Not difficult at all Not difficult at all    Review of Systems  Neurological:  Positive for dizziness.  Psychiatric/Behavioral:  The patient is nervous/anxious.         Objective:    There were no vitals taken for this visit. BP Readings from Last 3 Encounters:  11/26/22 122/66  11/15/22 122/64  10/29/22 122/74   Wt Readings from Last 3 Encounters:  11/26/22 220 lb 14.4 oz (100.2 kg)  11/15/22 218 lb 3.2 oz (99 kg)  10/29/22 218 lb 12.8 oz (99.2 kg)      Physical Exam Constitutional:      Appearance: Normal appearance.  HENT:     Head: Normocephalic and atraumatic.  Eyes:     Conjunctiva/sclera: Conjunctivae normal.  Cardiovascular:     Rate and Rhythm: Normal rate and  regular rhythm.  Pulmonary:     Effort: Pulmonary effort is normal.     Breath sounds: Normal breath sounds.  Skin:    General: Skin is warm and dry.  Neurological:     General: No focal deficit present.     Mental Status: He is alert. Mental status is at baseline.  Psychiatric:        Mood and Affect: Mood normal.        Behavior: Behavior normal.     No results found for any visits on 01/12/23.      Assessment & Plan:   1. Anxiety: Appears to be having some orthostatic hypotension side effects from the Wellbutrin, will discontinue.  We will try Prozac 10 mg to better control her anxiety.  Follow-up in 6 weeks to recheck.  - FLUoxetine (PROZAC) 10 MG capsule; Take 1 capsule (10 mg total) by mouth daily.  Dispense: 30 capsule; Refill: 1   No follow-ups on file.  Margarita Mail, DO

## 2023-01-12 ENCOUNTER — Ambulatory Visit: Payer: PRIVATE HEALTH INSURANCE | Admitting: Internal Medicine

## 2023-01-12 ENCOUNTER — Encounter: Payer: Self-pay | Admitting: Internal Medicine

## 2023-01-12 VITALS — BP 122/78 | HR 69 | Temp 97.8°F | Resp 18 | Ht 68.0 in | Wt 225.8 lb

## 2023-01-12 DIAGNOSIS — F419 Anxiety disorder, unspecified: Secondary | ICD-10-CM | POA: Diagnosis not present

## 2023-01-12 MED ORDER — FLUOXETINE HCL 20 MG PO TABS: 20.0000 mg | ORAL_TABLET | Freq: Every day | ORAL | 1 refills | Status: DC

## 2023-02-17 ENCOUNTER — Encounter: Payer: Self-pay | Admitting: Internal Medicine

## 2023-02-21 ENCOUNTER — Encounter: Payer: Self-pay | Admitting: Cardiology

## 2023-02-25 ENCOUNTER — Other Ambulatory Visit: Payer: Self-pay | Admitting: Internal Medicine

## 2023-02-25 DIAGNOSIS — I480 Paroxysmal atrial fibrillation: Secondary | ICD-10-CM

## 2023-02-25 MED ORDER — METOPROLOL SUCCINATE ER 50 MG PO TB24
50.0000 mg | ORAL_TABLET | Freq: Every day | ORAL | 0 refills | Status: DC
Start: 2023-02-25 — End: 2023-04-13

## 2023-02-25 NOTE — Telephone Encounter (Signed)
Requested Prescriptions  Pending Prescriptions Disp Refills   metoprolol succinate (TOPROL-XL) 50 MG 24 hr tablet 90 tablet 0    Sig: Take with or immediately following a meal.     Cardiovascular:  Beta Blockers Passed - 02/25/2023  5:58 PM      Passed - Last BP in normal range    BP Readings from Last 1 Encounters:  01/12/23 122/78         Passed - Last Heart Rate in normal range    Pulse Readings from Last 1 Encounters:  01/12/23 69         Passed - Valid encounter within last 6 months    Recent Outpatient Visits           1 month ago Anxiety   [facility] Margarita Mail, DO   3 months ago Anxiety   [facility] Margarita Mail, DO   3 months ago Annual physical exam   [facility] Margarita Mail, DO   3 months ago Anxiety   [facility] Margarita Mail, DO   5 months ago Anxiety   [facility] Margarita Mail, DO       Future Appointments             In 1 month Margarita Mail, DO [facility], [facility]

## 2023-04-12 NOTE — Progress Notes (Unsigned)
   Established Patient Office Visit  Subjective   Patient ID: Joseph Joseph, male    DOB: [birth year]  Age: 27 y.o. MRN: [id number]  No chief complaint on file.   HPI  Patient is here for follow up on chronic medical conditions.   Paroxysmal A.Fib: -Following with Cardiology, last seen on 11/13/21 -Underwent DC cardioversion in April 2023 when he was awoken from sleep with palpitations and tachycardia. His apple watch alerted to him being in A. Fib and he was diaphoretic and went to the ER where he was cardioverted.  -Echo 09/04/21 showing EF 55-60% -Currently on Metoprolol XL 50 mg, had been on Eliquis but no longer, EP discontinued it after his cardioversion  -Denies chest pain and shortness of breath but is still having some episodes of palpitations - like a skipped beat, beating too fast. Happened the other day, lasts a few seconds but happened a few times in a row. This occurs about twice a month.   Anxiety: -Currently on Buspar 7.5 mg BID and Prozac 20 mg (increased at LOV)  GERD: -Currently on Pantoprazole 20 mg daily, symptoms well controlled. Would like to try to wean off medications.  -Had acid reflux for about 1 year, has epigastric burning pain when symptomatic, no nausea or vomiting. Appetite good, no changes in weight.   Health Maintenance: -Blood work UTD -No vaccine history    {History (Optional):23778}  ROS    Objective:     There were no vitals taken for this visit. {Vitals History (Optional):23777}  Physical Exam   No results found for any visits on 04/13/23.  {Labs (Optional):23779}  The ASCVD Risk score (Arnett DK, et al., 2019) failed to calculate for the following reasons:   The 2019 ASCVD risk score is only valid for ages 68 to 37    Assessment & Plan:  There are no diagnoses linked to this encounter.   No follow-ups on file.    Margarita Mail, DO

## 2023-04-13 ENCOUNTER — Other Ambulatory Visit: Payer: Self-pay

## 2023-04-13 ENCOUNTER — Ambulatory Visit: Payer: PRIVATE HEALTH INSURANCE | Admitting: Internal Medicine

## 2023-04-13 ENCOUNTER — Encounter: Payer: Self-pay | Admitting: Internal Medicine

## 2023-04-13 VITALS — BP 118/74 | HR 84 | Temp 97.9°F | Resp 18 | Ht 68.0 in | Wt 225.0 lb

## 2023-04-13 DIAGNOSIS — I480 Paroxysmal atrial fibrillation: Secondary | ICD-10-CM

## 2023-04-13 DIAGNOSIS — F419 Anxiety disorder, unspecified: Secondary | ICD-10-CM

## 2023-04-13 MED ORDER — FLUOXETINE HCL 20 MG PO TABS
20.0000 mg | ORAL_TABLET | Freq: Every day | ORAL | 1 refills | Status: DC
Start: 2023-04-13 — End: 2023-11-10

## 2023-04-13 MED ORDER — METOPROLOL SUCCINATE ER 50 MG PO TB24
50.0000 mg | ORAL_TABLET | Freq: Every day | ORAL | 1 refills | Status: DC
Start: 2023-04-13 — End: 2023-08-23

## 2023-04-13 NOTE — Assessment & Plan Note (Addendum)
Improved, doing well on Prozac 20 mg, will refill today. Plan to follow up in 6 months and recheck labs at that time.

## 2023-04-13 NOTE — Assessment & Plan Note (Signed)
Stable, off blood thinners, now just on Metoprolol 50 mg XL and doing well, seeing Cardiology once a year.

## 2023-04-26 ENCOUNTER — Encounter: Payer: Self-pay | Admitting: Cardiology

## 2023-08-20 ENCOUNTER — Encounter: Payer: Self-pay | Admitting: Cardiology

## 2023-08-20 DIAGNOSIS — I480 Paroxysmal atrial fibrillation: Secondary | ICD-10-CM

## 2023-08-23 MED ORDER — METOPROLOL SUCCINATE ER 50 MG PO TB24: 50.0000 mg | ORAL_TABLET | Freq: Every day | ORAL | 3 refills | Status: DC

## 2023-09-25 NOTE — Progress Notes (Unsigned)
 Cardiology Clinic Note   Date: 09/28/2023 ID: Verba Girt, DOB Jan 23, 1996, MRN [id number]  [facility] Providers Cardiologist:  Constancia Delton, MD Electrophysiologist:  Boyce Byes, MD {  Chief Complaint   Joseph Joseph is a 28 y.o. male who presents to the clinic today for routine follow up.   Patient Profile   Joseph Joseph is followed by Dr. Junnie Olives and Dr. Marven Slimmer for the history outlined below.      Past medical history significant for: Palpitations/PAF. Onset April 2023. DCCV performed in ED 08/05/2021. Echo 09/04/2021: EF 55 to 60%.  No RWMA.  Normal diastolic parameters.  Normal RV size/function.  Trivial MR. 14-day ZIO 09/09/2022: HR 44 to 173 bpm, average 75 bpm, predominantly sinus rhythm.  Rare ectopy.  No evidence of A-fib.  Patient triggered events associated with PACs. GERD.   In summary, patient was first evaluated by Dr. Junnie Olives on 07/31/2021 for palpitations. He had been evaluated in the ED twice in March and was started on propranolol  as needed. He reported irregular/skipped beats increasing over 2 months and occurring daily lasting about 30 minutes.  He denied associated dizziness, syncope, chest pain or shortness of breath. ZIO was placed for further evaluation.    Patient presented to the ED on 08/05/2021 for palpitations and lightheadedness.  He reported his Apple Watch alerted for A-fib with heart rate in the 130s.  EKG showed A-fib, HR 129 bpm.  Labs were normal with no electrolyte derangement and normal thyroid .  Troponin negative.  He was given IV diltiazem .  He underwent cardioversion to restore sinus rhythm.  He was discharged on Toprol  and Eliquis .    He was seen in the office in follow-up on 08/14/2021.  He was maintaining sinus rhythm.  In other ZIO was ordered secondary to the first 1 being removed when in the ED.  14-day ZIO demonstrated HR 39 to 164 bpm, average 65 bpm, rare ectopy.  Echo showed normal LV/RV function.   Eliquis  was stopped after 5 weeks.  Patient was evaluated by Dr. Marven Slimmer on 10/07/2021.  He reported family history of A-fib in grandfather.  Mother with history of V-fib with ICD in place.  Sleep study was ordered.  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  He was instructed to continue monitoring rhythm with Apple Watch.  Sleep study was performed in June 2023 with no evidence of OSA.  Patient was last seen in the office by Dr. Junnie Olives on 08/17/2022 for routine follow-up.  He reported increased palpitations over the prior several weeks lasting a couple of hours.  He was in sinus rhythm per EKG at that time.  14-day ZIO demonstrated HR 44 to 173 bpm, average 75 bpm with no evidence of A-fib.     History of Present Illness    Today, patient is doing well. He reports still have occasional palpitations described as a pause or a skip. There is no pattern to when it happens. His BP is low today. He has a cuff at home but does not typically check it. He denies lightheadedness, dizziness, presyncope or syncope. He was started on Prozac  and he feels it is working well to manage his anxiety. It has caused him to gain weight. He is going to speak to his PCP about possibly changing to something else that does not have weight gain as a side effect.  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 He has not been exercising recently secondary to his work schedule. He has not been consuming alcohol other than on his birthday and he drank significantly less than he used to. He did not notice increased palpitations when he had alcohol. He does not always hydrate well.      ROS: All other systems reviewed and are otherwise negative except as noted in History of Present Illness.  EKGs/Labs Reviewed    EKG Interpretation Date/Time:  Wednesday September 28 2023 08:40:46 EDT Ventricular Rate:  60 PR Interval:  158 QRS  Duration:  86 QT Interval:  406 QTC Calculation: 406 R Axis:   9  Text Interpretation: Normal sinus rhythm RSR prime When compared with ECG of 05-Aug-2021 08:43, No significant change Confirmed by Morey Ar [id number]) on 09/28/2023 8:59:50 AM    Risk Assessment/Calculations     CHA2DS2-VASc Score = 0   This indicates a 0.2% annual risk of stroke. The patient's score is based upon: CHF History: 0 HTN History: 0 Diabetes History: 0 Stroke History: 0 Vascular Disease History: 0 Age Score: 0 Gender Score: 0         STOP-Bang Score:          Physical Exam    VS:  BP 90/60   Pulse 60   Ht 5\' 8"  (1.727 m)   Wt 250 lb 12.8 oz (113.8 kg)   SpO2 97%   BMI 38.13 kg/m  , BMI Body mass index is 38.13 kg/m.  GEN: Well nourished, well developed, in no acute distress. Neck: No JVD or carotid bruits. Cardiac:  RRR. No murmurs. No rubs or gallops.   Respiratory:  Respirations regular and unlabored. Clear to auscultation without rales, wheezing or rhonchi. GI: Soft, nontender, nondistended. Extremities: Radials/DP/PT 2+ and equal bilaterally. No clubbing or cyanosis. No edema.  Skin: Warm and dry, no rash. Neuro: Strength intact.  Assessment & Plan   Palpitations/PAF Onset April 2023.  S/p DCCV in the ED April 2023.  Echo May 2023 showed normal LV/RV function with normal diastolic parameters and trivial MR.  14-day ZIO May 2024 showed HR 44 to 173 bpm, average 75 bpm, predominantly sinus rhythm with rare ectopy and no evidence of A-fib.  Patient reports continued occasional palpitations described as a pause or skipped beat. It occurs with no pattern and is typically brief. He feels it is improved since starting on Toprol . He is overall abstaining from alcohol. EKG today shows normal sinus rhythm 60 bpm.  - Continue Toprol .  Low BP BP today 90/60 on intake and 104/62 on my recheck. He denies lightheadedness, dizziness, presyncope or syncope. He has a cuff at home but does not  typically check his BP.  He will check it a few times a week.  - Contact office for SBP consistently <100. Would consider decreased Toprol  for consistent low BP.   Anxiety Patient has a history of anxiety. He was started on Prozac  and feels it is better managed. He has noted weight gain since starting on the medication. He is going to speak to his PCP about possibly trying something else that does not cause weight gain.  - Continue to follow with PCP.   Disposition: Return in 1 year or sooner as needed. No labs today, as he has a visit with PCP next month.          Signed, Lonell Rives. Tatayana Beshears, DNP, NP-C

## 2023-09-28 ENCOUNTER — Ambulatory Visit: Payer: PRIVATE HEALTH INSURANCE | Attending: Student | Admitting: Student

## 2023-09-28 ENCOUNTER — Encounter: Payer: Self-pay | Admitting: Student

## 2023-09-28 VITALS — BP 90/60 | HR 60 | Ht 68.0 in | Wt 250.8 lb

## 2023-09-28 DIAGNOSIS — R002 Palpitations: Secondary | ICD-10-CM | POA: Diagnosis not present

## 2023-09-28 DIAGNOSIS — I480 Paroxysmal atrial fibrillation: Secondary | ICD-10-CM

## 2023-09-28 DIAGNOSIS — F419 Anxiety disorder, unspecified: Secondary | ICD-10-CM | POA: Diagnosis not present

## 2023-09-28 DIAGNOSIS — I959 Hypotension, unspecified: Secondary | ICD-10-CM

## 2023-09-28 NOTE — Patient Instructions (Signed)
 Medication Instructions:  Your physician recommends that you continue on your current medications as directed. Please refer to the Current Medication list given to you today.   *If you need a refill on your cardiac medications before your next appointment, please call your pharmacy*  Lab Work: None ordered at this time  If you have labs (blood work) drawn today and your tests are completely normal, you will receive your results only by: MyChart Message (if you have MyChart) OR A paper copy in the mail If you have any lab test that is abnormal or we need to change your treatment, we will call you to review the results.  Testing/Procedures: None ordered at this time   Follow-Up: At [facility], you and your health needs are our priority.  As part of our continuing mission to provide you with exceptional heart care, our providers are all part of one team.  This team includes your primary Cardiologist (physician) and Advanced Practice Providers or APPs (Physician Assistants and Nurse Practitioners) who all work together to provide you with the care you need, when you need it.  Your next appointment:   1 year(s)  Provider:   You may see Constancia Delton, MD or one of the following Advanced Practice Providers on your designated Care Team:   Laneta Pintos, NP Gildardo Labrador, PA-C Varney Gentleman, PA-C Cadence Orick, PA-C Ronald Cockayne, NP Morey Ar, NP    We recommend signing up for the patient portal called "MyChart".  Sign up information is provided on this After Visit Summary.  MyChart is used to connect with patients for Virtual Visits (Telemedicine).  Patients are able to view lab/test results, encounter notes, upcoming appointments, etc.  Non-urgent messages can be sent to your provider as well.   To learn more about what you can do with MyChart, go to [email or web address].

## 2023-10-13 ENCOUNTER — Encounter: Payer: Self-pay | Admitting: Internal Medicine

## 2023-10-14 ENCOUNTER — Ambulatory Visit (INDEPENDENT_AMBULATORY_CARE_PROVIDER_SITE_OTHER): Payer: PRIVATE HEALTH INSURANCE | Admitting: Internal Medicine

## 2023-10-14 ENCOUNTER — Encounter: Payer: Self-pay | Admitting: Internal Medicine

## 2023-10-14 VITALS — BP 110/72 | HR 83 | Temp 98.3°F | Resp 16 | Ht 68.0 in | Wt 249.2 lb

## 2023-10-14 DIAGNOSIS — R002 Palpitations: Secondary | ICD-10-CM | POA: Diagnosis not present

## 2023-10-14 NOTE — Progress Notes (Signed)
 Established Patient Office Visit  Subjective   Patient ID: Joseph Joseph, male    DOB: [birth year]  Age: 29 y.o. MRN: [id number]  Chief Complaint  Patient presents with   Dizziness    Warm feeling in chest for 1 month     Dizziness Pertinent negatives include no chest pain.   Discussed the use of AI scribe software for clinical note transcription with the patient, who gave verbal consent to proceed.  History of Present Illness  Joseph Joseph is a 28 year old male who presents with episodes of dizziness and chest sensations.  He experiences dizziness and a 'worn feeling' in his chest and legs for about one month. Initially infrequent, these episodes now occur twice daily and at night, lasting only a few seconds. There are no visual changes, but he feels heat in his chest during episodes. He has been on Prozac  for nearly a year with good results. A recent cardiology visit noted low blood pressure. He feels 'foggy' in his head without significant pain or pressure. Episodes occur while sitting at his desk or at work, and the first night episode woke him. He acknowledges work stress but finds the episodes somewhat random.  Paroxysmal A.Fib: -Following with Cardiology, last seen on 09/28/23 -Underwent DC cardioversion in April 2023 when he was awoken from sleep with palpitations and tachycardia. His apple watch alerted to him being in A. Fib and he was diaphoretic and went to the ER where he was cardioverted.  -Echo 09/04/21 showing EF 55-60% -Currently on Metoprolol  XL 50 mg, had been on Eliquis  but no longer, EP discontinued it after his cardioversion  -Denies chest pain and shortness of breath, will have a palpitation every once in awhile but becoming less frequent    Anxiety: -Currently on Prozac  20 mg       04/13/2023   10:16 AM 01/12/2023    8:20 AM 11/26/2022   11:28 AM 11/15/2022    1:45 PM 10/29/2022   11:00 AM  Depression screen PHQ 2/9  Decreased Interest 0 1 0 1 1  Down,  Depressed, Hopeless 0 1 1 0 1  PHQ - 2 Score 0 2 1 1 2   Altered sleeping 0 0 0 0 0  Tired, decreased energy 0 1 1 0 1  Change in appetite 0 1 2 0 3  Feeling bad or failure about yourself  0 0 0 0 1  Trouble concentrating 0 0 0 0 0  Moving slowly or fidgety/restless 0 0 0 0 0  Suicidal thoughts 0 0 0 0 0  PHQ-9 Score 0 4 4 1 7   Difficult doing work/chores Not difficult at all Not difficult at all Not difficult at all Not difficult at all Not difficult at all    Health Maintenance: -Blood work UTD -No vaccine history but does get vaccines, declines flu vaccine today but states he will get it at the pharmacy    Patient Active Problem List   Diagnosis Date Noted   Paroxysmal atrial fibrillation (HCC) 01/11/2022   Anxiety 01/11/2022   Gastroesophageal reflux disease 01/11/2022   BMI 32.0-32.9,adult 11/01/2018   Past Medical History:  Diagnosis Date   Anxiety    GERD (gastroesophageal reflux disease)    Hypertension    PAF (paroxysmal atrial fibrillation) (HCC)    a. 07/2021 s/p DCCV; b. 08/2021 Zio: no recurrent Afib; c. CHA2DS2VASc = 0.   No past surgical history on file. Social History   Tobacco Use   Smoking status: Former  Smokeless tobacco: Never  Vaping Use   Vaping status: Never Used  Substance Use Topics   Alcohol use: Not Currently    Comment: 4-5 drinks per week   Drug use: Never   Social History   Socioeconomic History   Marital status: Single    Spouse name: Not on file   Number of children: Not on file   Years of education: Not on file   Highest education level: Some college, no degree  Occupational History   Not on file  Tobacco Use   Smoking status: Former   Smokeless tobacco: Never  Vaping Use   Vaping status: Never Used  Substance and Sexual Activity   Alcohol use: Not Currently    Comment: 4-5 drinks per week   Drug use: Never   Sexual activity: Yes  Other Topics Concern   Not on file  Social History Narrative   Not on file   Social  Drivers of Health   Financial Resource Strain: Low Risk  (11/15/2022)   Overall Financial Resource Strain (CARDIA)    Difficulty of Paying Living Expenses: Not hard at all  Food Insecurity: No Food Insecurity (11/15/2022)   Hunger Vital Sign    Worried About Running Out of Food in the Last Year: Never true    Ran Out of Food in the Last Year: Never true  Recent Concern: Food Insecurity - Food Insecurity Present (09/17/2022)   Hunger Vital Sign    Worried About Running Out of Food in the Last Year: Sometimes true    Ran Out of Food in the Last Year: Never true  Transportation Needs: No Transportation Needs (11/15/2022)   PRAPARE - Administrator, Civil Service (Medical): No    Lack of Transportation (Non-Medical): No  Physical Activity: Unknown (11/15/2022)   Exercise Vital Sign    Days of Exercise per Week: 2 days    Minutes of Exercise per Session: Not on file  Recent Concern: Physical Activity - Insufficiently Active (11/15/2022)   Exercise Vital Sign    Days of Exercise per Week: 2 days    Minutes of Exercise per Session: 20 min  Stress: Stress Concern Present (11/15/2022)   [employer] of Occupational Health - Occupational Stress Questionnaire    Feeling of Stress : To some extent  Social Connections: Unknown (12/29/2022)   Received from [facility]   Social Network    Social Network: Not on file  Recent Concern: Social Connections - Socially Isolated (11/15/2022)   Social Connection and Isolation Panel    Frequency of Communication with Friends and Family: Twice a week    Frequency of Social Gatherings with Friends and Family: Once a week    Attends Religious Services: Never    Database administrator or Organizations: No    Attends Banker Meetings: Never    Marital Status: Never married  Intimate Partner Violence: Unknown (12/29/2022)   Received from [facility]   HITS    Physically Hurt: Not on file    Insult or Talk Down To: Not on file     Threaten Physical Harm: Not on file    Scream or Curse: Not on file   Family Status  Relation Name Status   Mother  Alive   Father  Alive  No partnership data on file   Family History  Problem Relation Age of Onset   Heart disease Mother    GER disease Mother    Allergies  Allergen Reactions  Rocephin [Ceftriaxone] Hives      Review of Systems  Constitutional:  Positive for malaise/fatigue.  Respiratory:  Negative for shortness of breath.   Cardiovascular:  Positive for palpitations. Negative for chest pain.  Neurological:  Positive for dizziness.  All other systems reviewed and are negative.     Objective:     BP 110/72 (Cuff Size: Large)   Pulse 83   Temp 98.3 F (36.8 C) (Oral)   Resp 16   Ht 5' 8 (1.727 m)   Wt 249 lb 3.2 oz (113 kg)   SpO2 96%   BMI 37.89 kg/m  BP Readings from Last 3 Encounters:  10/14/23 110/72  09/28/23 90/60  04/13/23 118/74   Wt Readings from Last 3 Encounters:  10/14/23 249 lb 3.2 oz (113 kg)  09/28/23 250 lb 12.8 oz (113.8 kg)  04/13/23 225 lb (102.1 kg)      Physical Exam Constitutional:      Appearance: Normal appearance.  HENT:     Head: Normocephalic and atraumatic.     Mouth/Throat:     Mouth: Mucous membranes are moist.     Pharynx: Oropharynx is clear.   Eyes:     Extraocular Movements: Extraocular movements intact.     Conjunctiva/sclera: Conjunctivae normal.     Pupils: Pupils are equal, round, and reactive to light.   Neck:     Comments: No thyromegaly  Cardiovascular:     Rate and Rhythm: Normal rate and regular rhythm.  Pulmonary:     Effort: Pulmonary effort is normal.     Breath sounds: Normal breath sounds.   Musculoskeletal:     Cervical back: No tenderness.  Lymphadenopathy:     Cervical: No cervical adenopathy.   Skin:    General: Skin is warm and dry.   Neurological:     General: No focal deficit present.     Mental Status: He is alert. Mental status is at baseline.   Psychiatric:         Mood and Affect: Mood normal.        Behavior: Behavior normal.      No results found for any visits on 10/14/23.  Last CBC Lab Results  Component Value Date   WBC 7.5 08/24/2022   HGB 14.2 08/24/2022   HCT 42.8 08/24/2022   MCV 93.4 08/24/2022   MCH 31.0 08/24/2022   RDW 12.1 08/24/2022   PLT 242 08/24/2022   Last metabolic panel Lab Results  Component Value Date   GLUCOSE 85 08/24/2022   NA 140 08/24/2022   K 4.7 08/24/2022   CL 104 08/24/2022   CO2 28 08/24/2022   BUN 9 08/24/2022   CREATININE 0.84 08/24/2022   EGFR 121 08/24/2022   CALCIUM 9.6 08/24/2022   PROT 7.3 08/24/2022   ALBUMIN 4.4 10/05/2020   BILITOT 0.6 08/24/2022   ALKPHOS 55 10/05/2020   AST 17 08/24/2022   ALT 16 08/24/2022   ANIONGAP 6 09/18/2021   Last lipids Lab Results  Component Value Date   CHOL 158 11/01/2018   HDL 50.70 11/01/2018   LDLCALC 97 11/01/2018   TRIG 52.0 11/01/2018   CHOLHDL 3 11/01/2018   Last hemoglobin A1c No results found for: HGBA1C Last thyroid  functions Lab Results  Component Value Date   TSH 1.629 08/05/2021   Last vitamin D  Lab Results  Component Value Date   VD25OH 13.62 (L) 11/01/2018   Last vitamin B12 and Folate Lab Results  Component Value Date   VITAMINB12  415 11/01/2018   FOLATE 11.1 11/01/2018      The ASCVD Risk score (Arnett DK, et al., 2019) failed to calculate for the following reasons:   The 2019 ASCVD risk score is only valid for ages 76 to 23    Assessment & Plan:   Assessment & Plan  Intermittent dizziness and chest sensations Intermittent dizziness and warm sensations potentially due to anxiety, Prozac  side effects, or cardiac issues. Cardiology evaluation and EKG were normal. Anxiety suspected. - Perform physical examination. - Order EKG - sinus bradycardia with HR 59, no other abnormalities  - Order laboratory tests: kidney, liver, electrolytes, thyroid  function. - Consider increasing Prozac  dosage if symptoms  persist without other cause.  Anxiety On Prozac  for anxiety. Current symptoms may require dosage adjustment. Maintain current regimen pending further evaluation. - Evaluate EKG and laboratory test results. - Consider increasing Prozac  dosage if symptoms persist and are anxiety-related. - Discuss medication change if symptoms worsen or become intolerable.  General Health Maintenance Routine laboratory tests due after one year. - Order laboratory tests: kidney, liver, electrolytes, thyroid  function.  - CBC w/Diff/Platelet - Comprehensive Metabolic Panel (CMET) - TSH - EKG 12-Lead   Return for already scheduled.    Rockney Cid, DO

## 2023-10-15 LAB — CBC WITH DIFFERENTIAL/PLATELET
Absolute Lymphocytes: 2327 {cells}/uL (ref 850–3900)
Absolute Monocytes: 832 {cells}/uL (ref 200–950)
Basophils Absolute: 42 {cells}/uL (ref 0–200)
Basophils Relative: 0.5 %
Eosinophils Absolute: 227 {cells}/uL (ref 15–500)
Eosinophils Relative: 2.7 %
HCT: 44.8 % (ref 38.5–50.0)
Hemoglobin: 15 g/dL (ref 13.2–17.1)
MCH: 31.1 pg (ref 27.0–33.0)
MCHC: 33.5 g/dL (ref 32.0–36.0)
MCV: 92.8 fL (ref 80.0–100.0)
MPV: 10 fL (ref 7.5–12.5)
Monocytes Relative: 9.9 %
Neutro Abs: 4973 {cells}/uL (ref 1500–7800)
Neutrophils Relative %: 59.2 %
Platelets: 267 10*3/uL (ref 140–400)
RBC: 4.83 10*6/uL (ref 4.20–5.80)
RDW: 12.3 % (ref 11.0–15.0)
Total Lymphocyte: 27.7 %
WBC: 8.4 10*3/uL (ref 3.8–10.8)

## 2023-10-15 LAB — COMPREHENSIVE METABOLIC PANEL WITH GFR
AG Ratio: 1.6 (calc) (ref 1.0–2.5)
ALT: 18 U/L (ref 9–46)
AST: 19 U/L (ref 10–40)
Albumin: 4.5 g/dL (ref 3.6–5.1)
Alkaline phosphatase (APISO): 58 U/L (ref 36–130)
BUN: 12 mg/dL (ref 7–25)
CO2: 25 mmol/L (ref 20–32)
Calcium: 9.8 mg/dL (ref 8.6–10.3)
Chloride: 103 mmol/L (ref 98–110)
Creat: 0.9 mg/dL (ref 0.60–1.26)
Globulin: 2.9 g/dL (ref 1.9–3.7)
Glucose, Bld: 74 mg/dL (ref 65–99)
Potassium: 4.5 mmol/L (ref 3.5–5.3)
Sodium: 138 mmol/L (ref 135–146)
Total Bilirubin: 0.5 mg/dL (ref 0.2–1.2)
Total Protein: 7.4 g/dL (ref 6.1–8.1)
eGFR: 118 mL/min/{1.73_m2} (ref >=60)

## 2023-10-15 LAB — TSH: TSH: 1.45 m[IU]/L (ref 0.40–4.50)

## 2023-10-16 ENCOUNTER — Ambulatory Visit: Payer: Self-pay | Admitting: Internal Medicine

## 2023-11-03 ENCOUNTER — Encounter: Payer: Self-pay | Admitting: Internal Medicine

## 2023-11-04 ENCOUNTER — Other Ambulatory Visit: Payer: Self-pay | Admitting: Internal Medicine

## 2023-11-09 ENCOUNTER — Encounter: Payer: Self-pay | Admitting: Internal Medicine

## 2023-11-09 DIAGNOSIS — F419 Anxiety disorder, unspecified: Secondary | ICD-10-CM

## 2023-11-10 MED ORDER — FLUOXETINE HCL 20 MG PO TABS: 20.0000 mg | ORAL_TABLET | Freq: Every day | ORAL | 0 refills | Status: DC

## 2023-11-11 ENCOUNTER — Other Ambulatory Visit: Payer: Self-pay | Admitting: Internal Medicine

## 2023-11-11 DIAGNOSIS — F419 Anxiety disorder, unspecified: Secondary | ICD-10-CM

## 2023-11-11 NOTE — Telephone Encounter (Signed)
 Duplicate request- filled 11/10/23 #90 Requested Prescriptions  Pending Prescriptions Disp Refills   FLUoxetine  (PROZAC ) 20 MG tablet [Pharmacy Med Name: FLUOXETINE  HCL 20 MG TABLET] 90 tablet 1    Sig: TAKE 1 TABLET BY MOUTH EVERY DAY     Psychiatry:  Antidepressants - SSRI Passed - 11/11/2023  4:21 PM      Passed - Valid encounter within last 6 months    Recent Outpatient Visits           4 weeks ago Palpitations   [facility] Bernardo Fend, DO       Future Appointments             In 6 days Bernardo Fend, DO [facility]

## 2023-11-17 ENCOUNTER — Ambulatory Visit: Payer: PRIVATE HEALTH INSURANCE | Admitting: Internal Medicine

## 2023-11-30 ENCOUNTER — Ambulatory Visit: Payer: PRIVATE HEALTH INSURANCE | Admitting: Internal Medicine

## 2023-12-02 ENCOUNTER — Encounter: Payer: Self-pay | Admitting: Internal Medicine

## 2023-12-05 ENCOUNTER — Ambulatory Visit: Payer: PRIVATE HEALTH INSURANCE | Admitting: Internal Medicine

## 2023-12-13 ENCOUNTER — Encounter: Payer: Self-pay | Admitting: Internal Medicine

## 2023-12-14 ENCOUNTER — Other Ambulatory Visit: Payer: Self-pay | Admitting: Internal Medicine

## 2023-12-14 DIAGNOSIS — F419 Anxiety disorder, unspecified: Secondary | ICD-10-CM

## 2023-12-14 MED ORDER — FLUOXETINE HCL 40 MG PO CAPS: 40.0000 mg | ORAL_CAPSULE | Freq: Every day | ORAL | 1 refills | Status: AC

## 2023-12-15 ENCOUNTER — Ambulatory Visit: Payer: PRIVATE HEALTH INSURANCE | Admitting: Internal Medicine

## 2023-12-23 ENCOUNTER — Encounter: Payer: Self-pay | Admitting: Internal Medicine

## 2023-12-23 ENCOUNTER — Ambulatory Visit (INDEPENDENT_AMBULATORY_CARE_PROVIDER_SITE_OTHER): Payer: PRIVATE HEALTH INSURANCE | Admitting: Internal Medicine

## 2023-12-23 ENCOUNTER — Other Ambulatory Visit: Payer: Self-pay

## 2023-12-23 VITALS — BP 110/76 | HR 71 | Temp 98.3°F | Resp 16 | Ht 68.0 in | Wt 254.6 lb

## 2023-12-23 DIAGNOSIS — G44219 Episodic tension-type headache, not intractable: Secondary | ICD-10-CM | POA: Diagnosis not present

## 2023-12-23 DIAGNOSIS — F419 Anxiety disorder, unspecified: Secondary | ICD-10-CM | POA: Diagnosis not present

## 2023-12-23 NOTE — Progress Notes (Signed)
 Established Patient Office Visit  Subjective   Patient ID: Valeria Krisko, male    DOB: 1995-06-09  Age: 28 y.o. MRN: [id number]  Chief Complaint  Patient presents with   Medical Management of Chronic Issues    Dizziness Associated symptoms include headaches.   Discussed the use of AI scribe software for clinical note transcription with the patient, who gave verbal consent to proceed.  History of Present Illness Reign Bartnick is a 28 year old male who presents with tension headaches and sleep disturbances.  He experiences daily tension headaches for the past week, worsening in the evening and lasting several hours. Acetaminophen, 1000 mg, provides relief. He has not used anti-inflammatory medications. He spends significant time in front of screens but reports no recent vision changes.  He has sleep disturbances, waking earlier than desired and unable to return to sleep, resulting in five to six hours of sleep per night. He attributes this to stress and anxiety, which he manages by recognizing symptoms and calming himself. He takes Prozac , 40 mg, which is generally effective. Occasionally, he experiences sharp pain in the back of his head, associated with anxiety.   Paroxysmal A.Fib: -Following with Cardiology, last seen on 09/28/23 -Underwent DC cardioversion in April 2023 when he was awoken from sleep with palpitations and tachycardia. His apple watch alerted to him being in A. Fib and he was diaphoretic and went to the ER where he was cardioverted.  -Echo 09/04/21 showing EF 55-60% -Currently on Metoprolol  XL 50 mg, had been on Eliquis  but no longer, EP discontinued it after his cardioversion  -Denies chest pain and shortness of breath, will have a palpitation every once in awhile but becoming less frequent    Anxiety: -Currently on Prozac  40 mg and doing well however he has been having disturbed sleep lately causing headaches       12/23/2023    1:35 PM 04/13/2023   10:16 AM  01/12/2023    8:20 AM 11/26/2022   11:28 AM 11/15/2022    1:45 PM  Depression screen PHQ 2/9  Decreased Interest 1 0 1 0 1  Down, Depressed, Hopeless 1 0 1 1 0  PHQ - 2 Score 2 0 2 1 1   Altered sleeping 2 0 0 0 0  Tired, decreased energy 2 0 1 1 0  Change in appetite 0 0 1 2 0  Feeling bad or failure about yourself  0 0 0 0 0  Trouble concentrating 0 0 0 0 0  Moving slowly or fidgety/restless 1 0 0 0 0  Suicidal thoughts 0 0 0 0 0  PHQ-9 Score 7 0 4 4 1   Difficult doing work/chores Not difficult at all Not difficult at all Not difficult at all Not difficult at all Not difficult at all    Health Maintenance: -Blood work UTD -No vaccine history but does get vaccines, declines flu vaccine today but states he will get it at the pharmacy    Patient Active Problem List   Diagnosis Date Noted   Paroxysmal atrial fibrillation (HCC) 01/11/2022   Anxiety 01/11/2022   Gastroesophageal reflux disease 01/11/2022   BMI 32.0-32.9,adult 11/01/2018   Past Medical History:  Diagnosis Date   Anxiety    GERD (gastroesophageal reflux disease)    Hypertension    PAF (paroxysmal atrial fibrillation) (HCC)    a. 07/2021 s/p DCCV; b. 08/2021 Zio: no recurrent Afib; c. CHA2DS2VASc = 0.   No past surgical history on file. Social History   Tobacco  Use   Smoking status: Former   Smokeless tobacco: Never  Vaping Use   Vaping status: Never Used  Substance Use Topics   Alcohol use: Not Currently    Comment: 4-5 drinks per week   Drug use: Never   Social History   Socioeconomic History   Marital status: Single    Spouse name: Not on file   Number of children: Not on file   Years of education: Not on file   Highest education level: Some college, no degree  Occupational History   Not on file  Tobacco Use   Smoking status: Former   Smokeless tobacco: Never  Vaping Use   Vaping status: Never Used  Substance and Sexual Activity   Alcohol use: Not Currently    Comment: 4-5 drinks per week    Drug use: Never   Sexual activity: Yes  Other Topics Concern   Not on file  Social History Narrative   Not on file   Social Drivers of Health   Financial Resource Strain: Low Risk  (11/15/2022)   Overall Financial Resource Strain (CARDIA)    Difficulty of Paying Living Expenses: Not hard at all  Food Insecurity: No Food Insecurity (11/15/2022)   Hunger Vital Sign    Worried About Running Out of Food in the Last Year: Never true    Ran Out of Food in the Last Year: Never true  Recent Concern: Food Insecurity - Food Insecurity Present (09/17/2022)   Hunger Vital Sign    Worried About Running Out of Food in the Last Year: Sometimes true    Ran Out of Food in the Last Year: Never true  Transportation Needs: No Transportation Needs (11/15/2022)   PRAPARE - Administrator, Civil Service (Medical): No    Lack of Transportation (Non-Medical): No  Physical Activity: Unknown (11/15/2022)   Exercise Vital Sign    Days of Exercise per Week: 2 days    Minutes of Exercise per Session: Not on file  Recent Concern: Physical Activity - Insufficiently Active (11/15/2022)   Exercise Vital Sign    Days of Exercise per Week: 2 days    Minutes of Exercise per Session: 20 min  Stress: Stress Concern Present (11/15/2022)   [employer] of Occupational Health - Occupational Stress Questionnaire    Feeling of Stress : To some extent  Social Connections: Unknown (12/29/2022)   Received from [facility]   Social Network    Social Network: Not on file  Recent Concern: Social Connections - Socially Isolated (11/15/2022)   Social Connection and Isolation Panel    Frequency of Communication with Friends and Family: Twice a week    Frequency of Social Gatherings with Friends and Family: Once a week    Attends Religious Services: Never    Database administrator or Organizations: No    Attends Banker Meetings: Never    Marital Status: Never married  Intimate Partner Violence:  Unknown (12/29/2022)   Received from [facility]   HITS    Physically Hurt: Not on file    Insult or Talk Down To: Not on file    Threaten Physical Harm: Not on file    Scream or Curse: Not on file   Family Status  Relation Name Status   Mother  Alive   Father  Alive  No partnership data on file   Family History  Problem Relation Age of Onset   Heart disease Mother    GER disease  Mother    Allergies  Allergen Reactions   Rocephin [Ceftriaxone] Hives      Review of Systems  Neurological:  Positive for dizziness and headaches.  Psychiatric/Behavioral:  The patient has insomnia.       Objective:     BP 110/76 (Cuff Size: Large)   Pulse 71   Temp 98.3 F (36.8 C) (Oral)   Resp 16   Ht 5' 8 (1.727 m)   Wt 254 lb 9.6 oz (115.5 kg)   SpO2 99%   BMI 38.71 kg/m  BP Readings from Last 3 Encounters:  12/23/23 110/76  10/14/23 110/72  09/28/23 90/60   Wt Readings from Last 3 Encounters:  12/23/23 254 lb 9.6 oz (115.5 kg)  10/14/23 249 lb 3.2 oz (113 kg)  09/28/23 250 lb 12.8 oz (113.8 kg)      Physical Exam Constitutional:      Appearance: Normal appearance.  HENT:     Head: Normocephalic and atraumatic.     Mouth/Throat:     Mouth: Mucous membranes are moist.     Pharynx: Oropharynx is clear.  Eyes:     Extraocular Movements: Extraocular movements intact.     Conjunctiva/sclera: Conjunctivae normal.     Pupils: Pupils are equal, round, and reactive to light.  Neck:     Comments:   Cardiovascular:     Rate and Rhythm: Normal rate and regular rhythm.  Pulmonary:     Effort: Pulmonary effort is normal.     Breath sounds: Normal breath sounds.  Skin:    General: Skin is warm and dry.  Neurological:     General: No focal deficit present.     Mental Status: He is alert. Mental status is at baseline.  Psychiatric:        Mood and Affect: Mood normal.        Behavior: Behavior normal.      No results found for any visits on 12/23/23.  Last  CBC Lab Results  Component Value Date   WBC 8.4 10/14/2023   HGB 15.0 10/14/2023   HCT 44.8 10/14/2023   MCV 92.8 10/14/2023   MCH 31.1 10/14/2023   RDW 12.3 10/14/2023   PLT 267 10/14/2023   Last metabolic panel Lab Results  Component Value Date   GLUCOSE 74 10/14/2023   NA 138 10/14/2023   K 4.5 10/14/2023   CL 103 10/14/2023   CO2 25 10/14/2023   BUN 12 10/14/2023   CREATININE 0.90 10/14/2023   EGFR 118 10/14/2023   CALCIUM 9.8 10/14/2023   PROT 7.4 10/14/2023   ALBUMIN 4.4 10/05/2020   BILITOT 0.5 10/14/2023   ALKPHOS 55 10/05/2020   AST 19 10/14/2023   ALT 18 10/14/2023   ANIONGAP 6 09/18/2021   Last lipids Lab Results  Component Value Date   CHOL 158 11/01/2018   HDL 50.70 11/01/2018   LDLCALC 97 11/01/2018   TRIG 52.0 11/01/2018   CHOLHDL 3 11/01/2018   Last hemoglobin A1c No results found for: HGBA1C Last thyroid  functions Lab Results  Component Value Date   TSH 1.45 10/14/2023   Last vitamin D  Lab Results  Component Value Date   VD25OH 13.62 (L) 11/01/2018   Last vitamin B12 and Folate Lab Results  Component Value Date   VITAMINB12 415 11/01/2018   FOLATE 11.1 11/01/2018      The ASCVD Risk score (Arnett DK, et al., 2019) failed to calculate for the following reasons:   The 2019 ASCVD risk score is only  valid for ages 79 to 54    Assessment & Plan:   Assessment & Plan  Tension-type headache Daily headaches for a week, likely tension-type. Possible causes: stress, dehydration, poor sleep, lack of supportive pillow. Differential includes migraine, unlikely severe conditions. - Take Aleve or Advil (2-3 tablets) to break headache cycle. - Follow up if headaches persist or worsen. - Ensure adequate hydration and use supportive pillow and mattress. - Consider caffeine as a remedy.  Insomnia Difficulty staying asleep, waking early, resulting in 5-6 hours of sleep. Stress and anxiety are possible factors. Prozac  effective for anxiety and  depression. - Continue Prozac  40 mg. - Consider Genesight testing if medication adjustments needed. - Practice good sleep hygiene and stress management.  Generalized anxiety disorder and major depressive disorder Prozac  40 mg effective in reducing anxiety. Occasional anxiety waves managed well. Potential for Genesight testing for future medication adjustments. - Continue Prozac  40 mg daily. - Consider Genesight testing if future medication adjustments needed.  Abdominal pain, resolved Brief abdominal pain resolved spontaneously, likely due to trapped gas or diet. - Use Gas-x or simethicone drops if symptoms recur. - Monitor dietary intake to avoid gas-producing foods.   Return in about 9 months (around 09/21/2024).    Sharyle Fischer, DO

## 2024-03-15 ENCOUNTER — Encounter: Payer: Self-pay | Admitting: Internal Medicine

## 2024-03-30 ENCOUNTER — Encounter: Payer: Self-pay | Admitting: Internal Medicine

## 2024-04-02 ENCOUNTER — Other Ambulatory Visit: Payer: Self-pay | Admitting: Internal Medicine

## 2024-04-02 DIAGNOSIS — N529 Male erectile dysfunction, unspecified: Secondary | ICD-10-CM

## 2024-04-02 MED ORDER — TADALAFIL 5 MG PO TABS: 5.0000 mg | ORAL_TABLET | Freq: Every day | ORAL | 0 refills | Status: AC | PRN

## 2024-04-03 ENCOUNTER — Other Ambulatory Visit (HOSPITAL_COMMUNITY): Payer: Self-pay

## 2024-05-15 ENCOUNTER — Encounter: Payer: Self-pay | Admitting: Internal Medicine

## 2024-05-15 DIAGNOSIS — I480 Paroxysmal atrial fibrillation: Secondary | ICD-10-CM

## 2024-05-16 MED ORDER — METOPROLOL SUCCINATE ER 50 MG PO TB24: 50.0000 mg | ORAL_TABLET | Freq: Every day | ORAL | 3 refills | Status: AC

## 2024-09-21 ENCOUNTER — Ambulatory Visit: Payer: PRIVATE HEALTH INSURANCE | Admitting: Internal Medicine
